# Patient Record
Sex: Male | Born: 1950 | Race: White | Hispanic: No | Marital: Married | Smoking: Current every day smoker
Health system: Southern US, Community
[De-identification: ages and names within clinical notes are randomized; demographics above are authoritative.]

## PROBLEM LIST (undated history)

## (undated) DIAGNOSIS — D8989 Other specified disorders involving the immune mechanism, not elsewhere classified: Secondary | ICD-10-CM

## (undated) DIAGNOSIS — I7782 Antineutrophilic cytoplasmic antibody (ANCA) vasculitis: Secondary | ICD-10-CM

## (undated) DIAGNOSIS — R569 Unspecified convulsions: Secondary | ICD-10-CM

## (undated) DIAGNOSIS — I776 Arteritis, unspecified: Secondary | ICD-10-CM

## (undated) HISTORY — PX: APPENDECTOMY: SHX54

---

## 2014-12-15 ENCOUNTER — Inpatient Hospital Stay (HOSPITAL_COMMUNITY)
Admission: EM | Admit: 2014-12-15 | Discharge: 2014-12-18 | DRG: 193 | Disposition: A | Discharge status: Home / Self Care | Payer: Medicare Other | Attending: Internal Medicine | Admitting: Internal Medicine

## 2014-12-15 ENCOUNTER — Encounter (HOSPITAL_COMMUNITY): Payer: Self-pay | Admitting: Emergency Medicine

## 2014-12-15 ENCOUNTER — Emergency Department (HOSPITAL_COMMUNITY): Payer: Medicare Other

## 2014-12-15 DIAGNOSIS — N183 Chronic kidney disease, stage 3 (moderate): Secondary | ICD-10-CM | POA: Diagnosis present

## 2014-12-15 DIAGNOSIS — D539 Nutritional anemia, unspecified: Secondary | ICD-10-CM | POA: Diagnosis present

## 2014-12-15 DIAGNOSIS — D72819 Decreased white blood cell count, unspecified: Secondary | ICD-10-CM | POA: Diagnosis present

## 2014-12-15 DIAGNOSIS — J9601 Acute respiratory failure with hypoxia: Secondary | ICD-10-CM

## 2014-12-15 DIAGNOSIS — Z9049 Acquired absence of other specified parts of digestive tract: Secondary | ICD-10-CM | POA: Diagnosis present

## 2014-12-15 DIAGNOSIS — D899 Disorder involving the immune mechanism, unspecified: Secondary | ICD-10-CM | POA: Diagnosis present

## 2014-12-15 DIAGNOSIS — G40909 Epilepsy, unspecified, not intractable, without status epilepticus: Secondary | ICD-10-CM | POA: Diagnosis present

## 2014-12-15 DIAGNOSIS — E876 Hypokalemia: Secondary | ICD-10-CM | POA: Diagnosis not present

## 2014-12-15 DIAGNOSIS — I776 Arteritis, unspecified: Secondary | ICD-10-CM

## 2014-12-15 DIAGNOSIS — M300 Polyarteritis nodosa: Secondary | ICD-10-CM | POA: Diagnosis present

## 2014-12-15 DIAGNOSIS — D649 Anemia, unspecified: Secondary | ICD-10-CM

## 2014-12-15 DIAGNOSIS — I129 Hypertensive chronic kidney disease with stage 1 through stage 4 chronic kidney disease, or unspecified chronic kidney disease: Secondary | ICD-10-CM | POA: Diagnosis present

## 2014-12-15 DIAGNOSIS — F17210 Nicotine dependence, cigarettes, uncomplicated: Secondary | ICD-10-CM | POA: Diagnosis present

## 2014-12-15 DIAGNOSIS — R509 Fever, unspecified: Secondary | ICD-10-CM | POA: Diagnosis not present

## 2014-12-15 DIAGNOSIS — R569 Unspecified convulsions: Secondary | ICD-10-CM

## 2014-12-15 DIAGNOSIS — J189 Pneumonia, unspecified organism: Secondary | ICD-10-CM | POA: Diagnosis not present

## 2014-12-15 DIAGNOSIS — I7782 Antineutrophilic cytoplasmic antibody (ANCA) vasculitis: Secondary | ICD-10-CM

## 2014-12-15 DIAGNOSIS — E785 Hyperlipidemia, unspecified: Secondary | ICD-10-CM | POA: Diagnosis present

## 2014-12-15 DIAGNOSIS — J9600 Acute respiratory failure, unspecified whether with hypoxia or hypercapnia: Secondary | ICD-10-CM

## 2014-12-15 HISTORY — DX: Antineutrophilic cytoplasmic antibody (ANCA) vasculitis: I77.82

## 2014-12-15 HISTORY — DX: Other specified disorders involving the immune mechanism, not elsewhere classified: D89.89

## 2014-12-15 HISTORY — DX: Unspecified convulsions: R56.9

## 2014-12-15 HISTORY — DX: Arteritis, unspecified: I77.6

## 2014-12-15 LAB — CBC WITH DIFFERENTIAL/PLATELET
Basophils Absolute: 0 10*3/uL (ref 0.0–0.1)
Basophils Relative: 0 % (ref 0–1)
EOS PCT: 0 % (ref 0–5)
Eosinophils Absolute: 0 10*3/uL (ref 0.0–0.7)
HCT: 23.9 % — ABNORMAL LOW (ref 39.0–52.0)
Hemoglobin: 8.3 g/dL — ABNORMAL LOW (ref 13.0–17.0)
LYMPHS ABS: 0.1 10*3/uL — AB (ref 0.7–4.0)
LYMPHS PCT: 3 % — AB (ref 12–46)
MCH: 38.2 pg — ABNORMAL HIGH (ref 26.0–34.0)
MCHC: 34.7 g/dL (ref 30.0–36.0)
MCV: 110.1 fL — AB (ref 78.0–100.0)
MONOS PCT: 9 % (ref 3–12)
Monocytes Absolute: 0.4 10*3/uL (ref 0.1–1.0)
Neutro Abs: 3.4 10*3/uL (ref 1.7–7.7)
Neutrophils Relative %: 88 % — ABNORMAL HIGH (ref 43–77)
PLATELETS: 188 10*3/uL (ref 150–400)
RBC: 2.17 MIL/uL — AB (ref 4.22–5.81)
RDW: 13.9 % (ref 11.5–15.5)
WBC: 3.9 10*3/uL — AB (ref 4.0–10.5)

## 2014-12-15 LAB — COMPREHENSIVE METABOLIC PANEL
ALBUMIN: 2.8 g/dL — AB (ref 3.5–5.2)
ALK PHOS: 73 U/L (ref 39–117)
ALT: 17 U/L (ref 0–53)
ANION GAP: 12 (ref 5–15)
AST: 15 U/L (ref 0–37)
BUN: 26 mg/dL — AB (ref 6–23)
CALCIUM: 8.9 mg/dL (ref 8.4–10.5)
CO2: 24 mmol/L (ref 19–32)
Chloride: 100 mmol/L (ref 96–112)
Creatinine, Ser: 1.71 mg/dL — ABNORMAL HIGH (ref 0.50–1.35)
GFR calc Af Amer: 47 mL/min — ABNORMAL LOW (ref >90)
GFR, EST NON AFRICAN AMERICAN: 41 mL/min — AB (ref >90)
GLUCOSE: 132 mg/dL — AB (ref 70–99)
Potassium: 3.6 mmol/L (ref 3.5–5.1)
Sodium: 136 mmol/L (ref 135–145)
TOTAL PROTEIN: 6 g/dL (ref 6.0–8.3)
Total Bilirubin: 0.7 mg/dL (ref 0.3–1.2)

## 2014-12-15 MED ORDER — LAMOTRIGINE 100 MG PO TABS
400.0000 mg | ORAL_TABLET | Freq: Two times a day (BID) | ORAL | Status: DC
  Administered 2014-12-15 – 2014-12-18 (×6): 400 mg via ORAL
  Filled 2014-12-15 (×8): qty 4

## 2014-12-15 MED ORDER — PROPRANOLOL HCL 80 MG PO TABS
80.0000 mg | ORAL_TABLET | Freq: Every day | ORAL | Status: DC
Start: 2014-12-16 — End: 2014-12-18
  Administered 2014-12-16 – 2014-12-18 (×3): 80 mg via ORAL
  Filled 2014-12-15 (×3): qty 1

## 2014-12-15 MED ORDER — ALBUTEROL SULFATE (2.5 MG/3ML) 0.083% IN NEBU
5.0000 mg | INHALATION_SOLUTION | RESPIRATORY_TRACT | Status: AC
  Administered 2014-12-15 (×3): 5 mg via RESPIRATORY_TRACT
  Filled 2014-12-15 (×3): qty 6

## 2014-12-15 MED ORDER — ENSURE COMPLETE PO LIQD: 237.0000 mL | Freq: Two times a day (BID) | ORAL | Status: DC

## 2014-12-15 MED ORDER — FUROSEMIDE 40 MG PO TABS
40.0000 mg | ORAL_TABLET | Freq: Every day | ORAL | Status: DC
  Administered 2014-12-16 – 2014-12-18 (×3): 40 mg via ORAL
  Filled 2014-12-15 (×3): qty 1

## 2014-12-15 MED ORDER — ACETAMINOPHEN 650 MG RE SUPP: 650.0000 mg | Freq: Four times a day (QID) | RECTAL | Status: DC | PRN

## 2014-12-15 MED ORDER — GUAIFENESIN-DM 100-10 MG/5ML PO SYRP: 10.0000 mL | ORAL_SOLUTION | Freq: Four times a day (QID) | ORAL | Status: DC | PRN

## 2014-12-15 MED ORDER — PANTOPRAZOLE SODIUM 40 MG PO TBEC
40.0000 mg | DELAYED_RELEASE_TABLET | Freq: Every day | ORAL | Status: DC
  Administered 2014-12-16 – 2014-12-18 (×3): 40 mg via ORAL
  Filled 2014-12-15 (×3): qty 1

## 2014-12-15 MED ORDER — TAMSULOSIN HCL 0.4 MG PO CAPS
0.4000 mg | ORAL_CAPSULE | Freq: Every day | ORAL | Status: DC
  Administered 2014-12-16 – 2014-12-18 (×3): 0.4 mg via ORAL
  Filled 2014-12-15 (×4): qty 1

## 2014-12-15 MED ORDER — DEXTROSE 5 % IV SOLN
1.0000 g | INTRAVENOUS | Status: DC
  Administered 2014-12-16 – 2014-12-17 (×2): 1 g via INTRAVENOUS
  Filled 2014-12-15 (×3): qty 1

## 2014-12-15 MED ORDER — SIMVASTATIN 40 MG PO TABS
40.0000 mg | ORAL_TABLET | Freq: Every day | ORAL | Status: DC
  Administered 2014-12-15 – 2014-12-17 (×3): 40 mg via ORAL
  Filled 2014-12-15 (×5): qty 1

## 2014-12-15 MED ORDER — SODIUM CHLORIDE 0.9 % IV SOLN: INTRAVENOUS | Status: DC

## 2014-12-15 MED ORDER — ACETAMINOPHEN 325 MG PO TABS
650.0000 mg | ORAL_TABLET | Freq: Four times a day (QID) | ORAL | Status: DC | PRN
  Administered 2014-12-17: 650 mg via ORAL
  Filled 2014-12-15: qty 2

## 2014-12-15 MED ORDER — OXYBUTYNIN CHLORIDE 5 MG PO TABS
5.0000 mg | ORAL_TABLET | Freq: Every day | ORAL | Status: DC
  Administered 2014-12-16 – 2014-12-18 (×3): 5 mg via ORAL
  Filled 2014-12-15 (×3): qty 1

## 2014-12-15 MED ORDER — VANCOMYCIN HCL IN DEXTROSE 1-5 GM/200ML-% IV SOLN
1000.0000 mg | INTRAVENOUS | Status: AC
  Administered 2014-12-15: 1000 mg via INTRAVENOUS
  Filled 2014-12-15: qty 200

## 2014-12-15 MED ORDER — OXYCODONE HCL 5 MG PO TABS: 5.0000 mg | ORAL_TABLET | ORAL | Status: DC | PRN

## 2014-12-15 MED ORDER — SERTRALINE HCL 50 MG PO TABS
50.0000 mg | ORAL_TABLET | Freq: Two times a day (BID) | ORAL | Status: DC
  Administered 2014-12-15 – 2014-12-18 (×6): 50 mg via ORAL
  Filled 2014-12-15 (×8): qty 1

## 2014-12-15 MED ORDER — LORAZEPAM 1 MG PO TABS
1.0000 mg | ORAL_TABLET | Freq: Two times a day (BID) | ORAL | Status: DC | PRN
  Administered 2014-12-16: 1 mg via ORAL
  Filled 2014-12-15: qty 1

## 2014-12-15 MED ORDER — SODIUM CHLORIDE 0.9 % IV SOLN
INTRAVENOUS | Status: DC
  Administered 2014-12-15 – 2014-12-17 (×5): via INTRAVENOUS

## 2014-12-15 MED ORDER — VANCOMYCIN HCL 500 MG IV SOLR
500.0000 mg | Freq: Two times a day (BID) | INTRAVENOUS | Status: DC
  Administered 2014-12-16 – 2014-12-18 (×5): 500 mg via INTRAVENOUS
  Filled 2014-12-15 (×6): qty 500

## 2014-12-15 MED ORDER — ATOVAQUONE 750 MG/5ML PO SUSP
1500.0000 mg | Freq: Every day | ORAL | Status: DC
Start: 2014-12-16 — End: 2014-12-18
  Administered 2014-12-16 – 2014-12-18 (×3): 1500 mg via ORAL
  Filled 2014-12-15 (×3): qty 10

## 2014-12-15 MED ORDER — ONDANSETRON HCL 4 MG PO TABS: 4.0000 mg | ORAL_TABLET | Freq: Four times a day (QID) | ORAL | Status: DC | PRN

## 2014-12-15 MED ORDER — HEPARIN SODIUM (PORCINE) 5000 UNIT/ML IJ SOLN
5000.0000 [IU] | Freq: Three times a day (TID) | INTRAMUSCULAR | Status: DC
  Administered 2014-12-15 – 2014-12-17 (×7): 5000 [IU] via SUBCUTANEOUS
  Filled 2014-12-15 (×11): qty 1

## 2014-12-15 MED ORDER — ALBUTEROL SULFATE (2.5 MG/3ML) 0.083% IN NEBU
2.5000 mg | INHALATION_SOLUTION | Freq: Four times a day (QID) | RESPIRATORY_TRACT | Status: DC | PRN
Start: 2014-12-15 — End: 2014-12-18
  Administered 2014-12-16: 2.5 mg via RESPIRATORY_TRACT
  Filled 2014-12-15: qty 3

## 2014-12-15 MED ORDER — DEXTROSE 5 % IV SOLN
1.0000 g | Freq: Once | INTRAVENOUS | Status: AC
  Administered 2014-12-15: 1 g via INTRAVENOUS
  Filled 2014-12-15: qty 1

## 2014-12-15 MED ORDER — ALUM & MAG HYDROXIDE-SIMETH 200-200-20 MG/5ML PO SUSP: 30.0000 mL | Freq: Four times a day (QID) | ORAL | Status: DC | PRN

## 2014-12-15 MED ORDER — CYCLOPHOSPHAMIDE 50 MG PO TABS: 150.0000 mg | ORAL_TABLET | Freq: Every day | ORAL | Status: DC

## 2014-12-15 MED ORDER — IPRATROPIUM BROMIDE 0.02 % IN SOLN
0.5000 mg | Freq: Once | RESPIRATORY_TRACT | Status: AC
  Administered 2014-12-15: 0.5 mg via RESPIRATORY_TRACT
  Filled 2014-12-15: qty 2.5

## 2014-12-15 MED ORDER — ONDANSETRON HCL 4 MG/2ML IJ SOLN: 4.0000 mg | Freq: Four times a day (QID) | INTRAMUSCULAR | Status: DC | PRN

## 2014-12-15 MED ORDER — ZOLPIDEM TARTRATE 10 MG PO TABS
10.0000 mg | ORAL_TABLET | Freq: Every evening | ORAL | Status: DC | PRN
  Administered 2014-12-17: 10 mg via ORAL
  Filled 2014-12-15: qty 1

## 2014-12-15 MED ORDER — HYDROMORPHONE HCL 1 MG/ML IJ SOLN: 0.5000 mg | INTRAMUSCULAR | Status: DC | PRN

## 2014-12-15 MED ORDER — LISINOPRIL 5 MG PO TABS
5.0000 mg | ORAL_TABLET | Freq: Every day | ORAL | Status: DC
Start: 2014-12-16 — End: 2014-12-18
  Administered 2014-12-16 – 2014-12-18 (×3): 5 mg via ORAL
  Filled 2014-12-15 (×3): qty 1

## 2014-12-15 MED ORDER — ADULT MULTIVITAMIN W/MINERALS CH
1.0000 | ORAL_TABLET | Freq: Every day | ORAL | Status: DC
  Administered 2014-12-16 – 2014-12-18 (×3): 1 via ORAL
  Filled 2014-12-15 (×3): qty 1

## 2014-12-15 NOTE — ED Notes (Signed)
Pt's wife states that pt has autoimmune disease, so they decided to take a trip to [place] bc not sure if they will have another.  First she had a virus then pt got the virus a week ago and been battling shob, cough, and fever.  [facility] and had EMS to come out earlier and assess him but wasn't transported.

## 2014-12-15 NOTE — Progress Notes (Signed)
Have received report from ED. Pt has not arrived to room. Called ED to ask about delay, was told that pt will be brought up at soon as a nurse or tech is available to bring the pt to the room. Eugene GarnetSarah M Supreme Rybarczyk RN, BSN

## 2014-12-15 NOTE — ED Notes (Signed)
Pt transported to XR.  

## 2014-12-15 NOTE — ED Provider Notes (Addendum)
CSN: [id number]     Arrival date & time 12/15/14  1529 History   First MD Initiated Contact with Patient 12/15/14 1611     Chief Complaint  Patient presents with  . Shortness of Breath  . coughing   . Fever    HPI   Pt is travelling from  [place] to [place].  In the last four  days he started having trouble with cough, fever, and congestion, green sputum.  Temp taken this am was 102. Some diarrhea.  No vomiting.    Family members had a recent similar illness.  They have recovered but he has not.  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  He is on multiple medications for that Past Medical History  Diagnosis Date  . Autoimmune disorder   . ANCA-associated vasculitis     with MPA  . Seizures    Past Surgical History  Procedure Laterality Date  . Appendectomy     No family history on file. History  Substance Use Topics  . Smoking status: Current Every Day Smoker    Types: Cigarettes  . Smokeless tobacco: Not on file  . Alcohol Use: Not on file    Review of Systems  Respiratory: Positive for cough.   Gastrointestinal:       Some difficulty swallowing   All other systems reviewed and are negative.     Allergies  Review of patient's allergies indicates no known allergies.  Home Medications   Prior to Admission medications   Medication Sig Start Date End Date Taking? Authorizing Provider  atovaquone (MEPRON) 750 MG/5ML suspension Take 1,500 mg by mouth daily.   Yes Historical Provider, MD  cyclophosphamide (CYTOXAN) 25 MG tablet Take 150 mg by mouth daily. Give on an empty stomach 1 hour before or 2 hours after meals.   Yes Historical Provider, MD  furosemide (LASIX) 40 MG tablet Take 40 mg by mouth daily.   Yes Historical Provider, MD  lamoTRIgine (LAMICTAL) 200 MG tablet Take 400 mg by mouth 2 (two) times daily.   Yes Historical Provider, MD  lisinopril (PRINIVIL,ZESTRIL) 5 MG tablet Take 5 mg by mouth daily.   Yes Historical Provider, MD  LORazepam  (ATIVAN) 1 MG tablet Take 1 mg by mouth 2 (two) times daily as needed for anxiety.   Yes Historical Provider, MD  Multiple Vitamins-Minerals (MULTIVITAMIN WITH MINERALS) tablet Take 1 tablet by mouth daily.   Yes Historical Provider, MD  omeprazole (PRILOSEC) 40 MG capsule Take 40 mg by mouth daily.   Yes Historical Provider, MD  oxybutynin (DITROPAN) 5 MG tablet Take 5 mg by mouth daily.   Yes Historical Provider, MD  propranolol (INDERAL) 80 MG tablet Take 80 mg by mouth daily.   Yes Historical Provider, MD  sertraline (ZOLOFT) 50 MG tablet Take 50 mg by mouth 2 (two) times daily.   Yes Historical Provider, MD  simvastatin (ZOCOR) 40 MG tablet Take 40 mg by mouth daily.   Yes Historical Provider, MD  tamsulosin (FLOMAX) 0.4 MG CAPS capsule Take 0.4 mg by mouth daily after breakfast.   Yes Historical Provider, MD  zolpidem (AMBIEN) 10 MG tablet Take 10 mg by mouth at bedtime as needed for sleep.   Yes Historical Provider, MD   BP 101/67 mmHg  Pulse 94  Temp(Src) 98.4 F (36.9 C) (Oral)  Resp 15  SpO2 95% Physical Exam  Constitutional: He has a sickly appearance. No distress.  frail  HENT:  Head: Normocephalic and atraumatic.  Right Ear: External ear normal.  Left Ear: External ear normal.  White exudate oral mucosa, dry mm  Eyes: Conjunctivae are normal. Right eye exhibits no discharge. Left eye exhibits no discharge. No scleral icterus.  Neck: Neck supple. No tracheal deviation present.  Cardiovascular: Normal rate, regular rhythm and intact distal pulses.   Pulmonary/Chest: Effort normal. No stridor. No respiratory distress. He has wheezes. He has rales.  Abdominal: Soft. Bowel sounds are normal. He exhibits no distension. There is no tenderness. There is no rebound and no guarding.  Musculoskeletal: He exhibits no edema or tenderness.  Neurological: He is alert. No cranial nerve deficit (no facial droop, extraocular movements intact, no slurred speech) or sensory deficit. He  exhibits normal muscle tone. He displays no seizure activity. Coordination normal.  General weakness  Skin: Skin is warm and dry. No rash noted.  Psychiatric: He has a normal mood and affect.  Nursing note and vitals reviewed.   ED Course  Procedures (including critical care time) Labs Review Labs Reviewed  CBC WITH DIFFERENTIAL/PLATELET - Abnormal; Notable for the following:    WBC 3.9 (*)    RBC 2.17 (*)    Hemoglobin 8.3 (*)    HCT 23.9 (*)    MCV 110.1 (*)    MCH 38.2 (*)    Neutrophils Relative % 88 (*)    Lymphocytes Relative 3 (*)    Lymphs Abs 0.1 (*)    All other components within normal limits  COMPREHENSIVE METABOLIC PANEL - Abnormal; Notable for the following:    Glucose, Bld 132 (*)    BUN 26 (*)    Creatinine, Ser 1.71 (*)    Albumin 2.8 (*)    GFR calc non Af Amer 41 (*)    GFR calc Af Amer 47 (*)    All other components within normal limits    Imaging Review Dg Chest 2 View  12/15/2014   CLINICAL DATA:  Shortness of breath cough and fever for 1 week  EXAM: CHEST  2 VIEW  COMPARISON:  None.  FINDINGS: Cardiac shadow is within normal limits. Increased density is noted in the right lung base projecting in the right lower lobe consistent with acute pneumonia. No bony abnormality is seen.  IMPRESSION: Changes in the right lower lobe consistent with pneumonia. Followup following appropriate therapy is recommended.   Electronically Signed   By: Alcide Clever M.D.   On: 12/15/2014 17:06   Medications  albuterol (PROVENTIL) (2.5 MG/3ML) 0.083% nebulizer solution 5 mg (5 mg Nebulization Given 12/15/14 1734)  ceFEPIme (MAXIPIME) 1 g in dextrose 5 % 50 mL IVPB (not administered)  ipratropium (ATROVENT) nebulizer solution 0.5 mg (0.5 mg Nebulization Given 12/15/14 1705)     MDM   Final diagnoses:  HCAP (healthcare-associated pneumonia)    Pt has pna on xray.  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  On chronic mepron to help prevent opportunistic infections.  Pt has been  receiving injections and exposed to HCAP pathogens.  Will start on hcap abx.  Admit considering his medical conditions, and hypoxia.  He is not on oxygen normally.  Family dose have prior labs with them.  His anemia, wbc count and renal function are unchanged from baseline  Pt and family agree    Linwood Dibbles, MD 12/15/14 [phone]

## 2014-12-15 NOTE — H&P (Signed)
[facility] Admission History and Physical       Ryan KindleWilliam Bonura UJW:[id number] DOB: Mar 07, 1951 DOA: 12/15/2014  Referring physician: EDP PCP: Pcp Not In System Dr Marianna FussLoren Hannah in [place] [facility]:   Chief Complaint: Fever Chills SOB Cough and Chest congestion  HPI: Ryan Gentry is a 64 y.o. male with a history of ANCA with Microscopic Polyangiitis ( dx 07/2014) who presents to the ED with complaints of worsening SOB, cough with Fevers and chills  X 4 days.   He reports having a temperature of 102 this AM.   He has been coughing up yellowish- green sputum.    He and his wife were traveling from [place] back home to [place].   He reports that he has nausea and vomiting due to his daily chemo Rx of cytoxan.  He has also had some diarrhea. In the ED, his o2 sats were down to 88% and he was placed on 2 liters NCO2 with improvement and a Chest X-Ray revealed a RLL Pneumonia and he was placed on IV Vancomycin and Cefepime and referred for admission.     Review of Systems:  Constitutional: No Weight Loss, No Weight Gain, Night Sweats,+Fevers, +Chills, Dizziness, Light Headedness, Fatigue, or +Generalized Weakness HEENT: No Headaches, Difficulty Swallowing,Tooth/Dental Problems,Sore Throat,  No Sneezing, Rhinitis, Ear Ache, Nasal Congestion, or Post Nasal Drip,  Cardio-vascular:  No Chest pain, Orthopnea, PND, Edema in Lower Extremities, Anasarca, Dizziness, Palpitations  Resp: +Dyspnea, No DOE, +Productive Cough, No Non-Productive Cough, No Hemoptysis, No Wheezing.    GI: No Heartburn, Indigestion, Abdominal Pain, Nausea, Vomiting, Diarrhea, Constipation, Hematemesis, Hematochezia, Melena, Change in Bowel Habits,  Loss of Appetite  GU: No Dysuria, No Change in Color of Urine, No Urgency or Urinary Frequency, No Flank pain.  Musculoskeletal: No Joint Pain or Swelling, No Decreased Range of Motion, No Back Pain.  Neurologic: No Syncope, No Seizures, Muscle Weakness, Paresthesia, Vision  Disturbance or Loss, No Diplopia, No Vertigo, No Difficulty Walking,  Skin: No Rash or Lesions. Psych: No Change in Mood or Affect, No Depression or Anxiety, No Memory loss, No Confusion, or Hallucinations   Past Medical History  Diagnosis Date  . Autoimmune disorder   . ANCA-associated vasculitis     with MPA  . Seizures      Past Surgical History  Procedure Laterality Date  . Appendectomy        Prior to Admission medications   Medication Sig Start Date End Date Taking? Authorizing Provider  atovaquone (MEPRON) 750 MG/5ML suspension Take 1,500 mg by mouth daily.   Yes Historical Provider, MD  cyclophosphamide (CYTOXAN) 25 MG tablet Take 150 mg by mouth daily. Give on an empty stomach 1 hour before or 2 hours after meals.   Yes Historical Provider, MD  furosemide (LASIX) 40 MG tablet Take 40 mg by mouth daily.   Yes Historical Provider, MD  lamoTRIgine (LAMICTAL) 200 MG tablet Take 400 mg by mouth 2 (two) times daily.   Yes Historical Provider, MD  lisinopril (PRINIVIL,ZESTRIL) 5 MG tablet Take 5 mg by mouth daily.   Yes Historical Provider, MD  LORazepam (ATIVAN) 1 MG tablet Take 1 mg by mouth 2 (two) times daily as needed for anxiety.   Yes Historical Provider, MD  Multiple Vitamins-Minerals (MULTIVITAMIN WITH MINERALS) tablet Take 1 tablet by mouth daily.   Yes Historical Provider, MD  omeprazole (PRILOSEC) 40 MG capsule Take 40 mg by mouth daily.   Yes Historical Provider, MD  oxybutynin (DITROPAN) 5 MG tablet Take 5  mg by mouth daily.   Yes Historical Provider, MD  propranolol (INDERAL) 80 MG tablet Take 80 mg by mouth daily.   Yes Historical Provider, MD  sertraline (ZOLOFT) 50 MG tablet Take 50 mg by mouth 2 (two) times daily.   Yes Historical Provider, MD  simvastatin (ZOCOR) 40 MG tablet Take 40 mg by mouth daily.   Yes Historical Provider, MD  tamsulosin (FLOMAX) 0.4 MG CAPS capsule Take 0.4 mg by mouth daily after breakfast.   Yes Historical Provider, MD  zolpidem  (AMBIEN) 10 MG tablet Take 10 mg by mouth at bedtime as needed for sleep.   Yes Historical Provider, MD     No Known Allergies     Social History:  reports that he has been smoking Cigarettes.  He has a 25 pack-year smoking history. He has never used smokeless tobacco. He reports that he does not drink alcohol or use illicit drugs.      History reviewed. No pertinent family history.     Physical Exam:  GEN:  Pleasant ill Appearing Well Nourished and Well Developed  64 y.o. Caucasian male examined and in no acute distress; cooperative with exam Filed Vitals:   12/15/14 1714 12/15/14 1715 12/15/14 1830 12/15/14 1846  BP:    115/66  Pulse:    86  Temp:      TempSrc:      Resp:    20  Height:    (1.778 m)   Weight:   69.854 kg (154 lb)   SpO2: 88% 95%  93%   Blood pressure 115/66, pulse 86, temperature 98.4 F (36.9 C), temperature source Oral, resp. rate 20, height  (1.778 m), weight 69.854 kg (154 lb), SpO2 93 %. PSYCH: He is alert and oriented x4; does not appear anxious does not appear depressed; affect is normal HEENT: Normocephalic and Atraumatic, Mucous membranes pink; PERRLA; EOM intact; Fundi:  Benign;  No scleral icterus, Nares: Patent, Oropharynx: Clear, Fair Dentition,    Neck:  FROM, No Cervical Lymphadenopathy nor Thyromegaly or Carotid Bruit; No JVD; Breasts:: Not examined CHEST WALL: No tenderness CHEST: Normal respiration, clear to auscultation bilaterally HEART: Regular rate and rhythm; no murmurs rubs or gallops BACK: No kyphosis or scoliosis; No CVA tenderness ABDOMEN: Positive Bowel Sounds, Soft Non-Tender, No Rebound or Guarding; No Masses, No Organomegaly. Rectal Exam: Not done EXTREMITIES: No Cyanosis, Clubbing, or Edema; No Ulcerations. Genitalia: not examined PULSES: 2+ and symmetric SKIN: Normal hydration no rash or ulceration CNS:  Alert and Oriented x 4, No Focal Deficits Vascular: pulses palpable throughout    Labs on Admission:    Basic Metabolic Panel:  Recent Labs Lab 12/15/14 1645  NA 136  K 3.6  CL 100  CO2 24  GLUCOSE 132*  BUN 26*  CREATININE 1.71*  CALCIUM 8.9   Liver Function Tests:  Recent Labs Lab 12/15/14 1645  AST 15  ALT 17  ALKPHOS 73  BILITOT 0.7  PROT 6.0  ALBUMIN 2.8*   No results for input(s): LIPASE, AMYLASE in the last 168 hours. No results for input(s): AMMONIA in the last 168 hours. CBC:  Recent Labs Lab 12/15/14 1645  WBC 3.9*  NEUTROABS 3.4  HGB 8.3*  HCT 23.9*  MCV 110.1*  PLT 188   Cardiac Enzymes: No results for input(s): CKTOTAL, CKMB, CKMBINDEX, TROPONINI in the last 168 hours.  BNP (last 3 results) No results for input(s): BNP in the last 8760 hours.  ProBNP (last 3 results) No results for  input(s): PROBNP in the last 8760 hours.  CBG: No results for input(s): GLUCAP in the last 168 hours.  Radiological Exams on Admission: Dg Chest 2 View  12/15/2014   CLINICAL DATA:  Shortness of breath cough and fever for 1 week  EXAM: CHEST  2 VIEW  COMPARISON:  None.  FINDINGS: Cardiac shadow is within normal limits. Increased density is noted in the right lung base projecting in the right lower lobe consistent with acute pneumonia. No bony abnormality is seen.  IMPRESSION: Changes in the right lower lobe consistent with pneumonia. Followup following appropriate therapy is recommended.   Electronically Signed   By: Alcide Clever M.D.   On: 12/15/2014 17:06       Assessment/Plan:   64 y.o. male with  Principal Problem:   1.     HCAP (healthcare-associated pneumonia)   IV Vancomycin and Cefepime   Albuterol Nebs   O2 PRN   Monitor O2 Sats `  Robitussin with Dextramethorphan q 6 hrs PRN   Active Problems:   2.    Acute respiratory failure   NCO2 PRN   Monitor O2 sats     3.    ANCA-positive vasculitis   On Cytoxan and Atovaquone Rx    Please Notify      4.    Seizures   Continue Lamictal Rx     5.    Leukopenia   Monitor Trend     6.     Anemia   Monitor Trend     7.    DVT Prophylaxis    SQ Heparin           Code Status:     FULL CODE        Family Communication:   Wife at Bedside       Disposition Plan:    Inpatient Status        Time spent:  21 Minutes      Ron Parker [facility] Pager [phone]   If 7AM -7PM Please Contact the Day Rounding Team MD for [facility]  If 7PM-7AM, Please Contact Night-Floor Coverage  [email or web address] Password [facility] 12/15/2014, 7:37 PM     ADDENDUM:   Patient was seen and examined on 12/15/2014

## 2014-12-15 NOTE — Progress Notes (Addendum)
ANTIBIOTIC CONSULT NOTE - INITIAL  Pharmacy Consult for Vancomycin & Cefepime Indication: pneumonia  No Known Allergies  Patient Measurements: Height: 5\' 10"  (177.8 cm) Weight: 154 lb (69.854 kg) IBW/kg (Calculated) : 73  Vital Signs: Temp: 98.4 F (36.9 C) (03/08 1602) Temp Source: Oral (03/08 1602) BP: 115/66 mmHg (03/08 1846) Pulse Rate: 86 (03/08 1846) Intake/Output from previous day:   Intake/Output from this shift:    Labs:  Recent Labs  12/15/14 1645  WBC 3.9*  HGB 8.3*  PLT 188  CREATININE 1.71*   Estimated Creatinine Clearance: 43.1 mL/min (by C-G formula based on Cr of 1.71). No results for input(s): VANCOTROUGH, VANCOPEAK, VANCORANDOM, GENTTROUGH, GENTPEAK, GENTRANDOM, TOBRATROUGH, TOBRAPEAK, TOBRARND, AMIKACINPEAK, AMIKACINTROU, AMIKACIN in the last 72 hours.   Microbiology: No results found for this or any previous visit (from the past 720 hour(s)).  Medical History: Past Medical History  Diagnosis Date  . Autoimmune disorder   . ANCA-associated vasculitis     with MPA  . Seizures     Medications:  Scheduled:   Infusions:  . [START ON 12/16/2014] vancomycin    . vancomycin 1,000 mg (12/15/14 1851)   Assessment:  [age] yr male with fever, productive cough and congestion. Autoimmune disorder vasculitis.  Recently exposed to sick family members.  Xray shows pneumonia  CrCl ~ 43 ml/min  Cefepime 1gm x 1 ordered by MD in ED  Pharmacy consulted to dose Vancomycin and Cefepime for pneumonia  Goal of Therapy:  Vancomycin trough level 15-20 mcg/ml  Plan:  Measure antibiotic drug levels at steady state Follow up culture results  Vancomycin 1gm IV x 1 then 500mg  IV q12h Cefepime 1gm IV q24h  Kimani Hovis, Joselyn GlassmanLeann Trefz, PharmD 12/15/2014,7:10 PM

## 2014-12-16 LAB — CBC
HCT: 21.2 % — ABNORMAL LOW (ref 39.0–52.0)
Hemoglobin: 7.2 g/dL — ABNORMAL LOW (ref 13.0–17.0)
MCH: 37.3 pg — ABNORMAL HIGH (ref 26.0–34.0)
MCHC: 34 g/dL (ref 30.0–36.0)
MCV: 109.8 fL — ABNORMAL HIGH (ref 78.0–100.0)
Platelets: 153 10*3/uL (ref 150–400)
RBC: 1.93 MIL/uL — AB (ref 4.22–5.81)
RDW: 14.2 % (ref 11.5–15.5)
WBC: 2.9 10*3/uL — ABNORMAL LOW (ref 4.0–10.5)

## 2014-12-16 LAB — VITAMIN B12: Vitamin B-12: 474 pg/mL (ref 211–911)

## 2014-12-16 LAB — BASIC METABOLIC PANEL
Anion gap: 7 (ref 5–15)
BUN: 23 mg/dL (ref 6–23)
CALCIUM: 8.1 mg/dL — AB (ref 8.4–10.5)
CHLORIDE: 105 mmol/L (ref 96–112)
CO2: 27 mmol/L (ref 19–32)
Creatinine, Ser: 1.62 mg/dL — ABNORMAL HIGH (ref 0.50–1.35)
GFR calc Af Amer: 50 mL/min — ABNORMAL LOW (ref >90)
GFR, EST NON AFRICAN AMERICAN: 43 mL/min — AB (ref >90)
Glucose, Bld: 90 mg/dL (ref 70–99)
POTASSIUM: 2.9 mmol/L — AB (ref 3.5–5.1)
Sodium: 139 mmol/L (ref 135–145)

## 2014-12-16 LAB — FERRITIN: Ferritin: 653 ng/mL — ABNORMAL HIGH (ref 22–322)

## 2014-12-16 LAB — PREPARE RBC (CROSSMATCH)

## 2014-12-16 LAB — ABO/RH: ABO/RH(D): B POS

## 2014-12-16 LAB — IRON AND TIBC
Iron: 41 ug/dL — ABNORMAL LOW (ref 42–165)
Saturation Ratios: 25 % (ref 20–55)
TIBC: 162 ug/dL — ABNORMAL LOW (ref 215–435)
UIBC: 121 ug/dL — ABNORMAL LOW (ref 125–400)

## 2014-12-16 LAB — FOLATE: Folate: >20 ng/mL

## 2014-12-16 LAB — RETICULOCYTES
RBC.: 1.97 MIL/uL — ABNORMAL LOW (ref 4.22–5.81)
Retic Count, Absolute: 23.6 K/uL (ref 19.0–186.0)
Retic Ct Pct: 1.2 % (ref 0.4–3.1)

## 2014-12-16 MED ORDER — POTASSIUM CHLORIDE CRYS ER 20 MEQ PO TBCR
40.0000 meq | EXTENDED_RELEASE_TABLET | Freq: Two times a day (BID) | ORAL | Status: AC
  Administered 2014-12-16 (×2): 40 meq via ORAL
  Filled 2014-12-16: qty 2

## 2014-12-16 MED ORDER — SODIUM CHLORIDE 0.9 % IV SOLN: Freq: Once | INTRAVENOUS | Status: DC

## 2014-12-16 MED ORDER — NEPRO/CARBSTEADY PO LIQD
237.0000 mL | Freq: Two times a day (BID) | ORAL | Status: DC
  Administered 2014-12-17: 237 mL via ORAL
  Filled 2014-12-16 (×5): qty 237

## 2014-12-16 MED ORDER — CYCLOPHOSPHAMIDE 50 MG PO TABS: 150.0000 mg | ORAL_TABLET | Freq: Every day | ORAL | Status: DC

## 2014-12-16 MED ORDER — DARBEPOETIN ALFA 40 MCG/0.4ML IJ SOSY
40.0000 ug | PREFILLED_SYRINGE | Freq: Once | INTRAMUSCULAR | Status: DC
  Filled 2014-12-16: qty 0.4

## 2014-12-16 NOTE — Progress Notes (Signed)
INITIAL NUTRITION ASSESSMENT  DOCUMENTATION CODES Per approved criteria  -Not Applicable   INTERVENTION: Consider switching diet order to renal diet as this is what the patient follows PTA Provide Nepro Shake po BID, each supplement provides 425 kcal and 19 grams protein Encourage PO intake RD to continue to monitor  NUTRITION DIAGNOSIS: Unintentional weight loss related to multiple co-morbidities as evidenced by 30 lb weight loss x 4.5 months.   Goal: Pt to meet >/= 90% of their estimated nutrition needs   Monitor:  PO and supplemental intake, weight, labs, I/O's  Reason for Assessment: Pt identified as at nutrition risk on the Malnutrition Screen Tool  Admitting Dx: HCAP (healthcare-associated pneumonia)  ASSESSMENT: 64 y.o. male with a history of ANCA with Microscopic Polyangiitis ( dx 07/2014) who presents to the ED with complaints of worsening SOB, cough with Fevers and chills X 4 days.  Pt reports following a renal diet PTA. Pt currently on regular diet and ordered Ensure supplements. 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 Pt with Hx of dialysis and plasmaphoresis which were stopped to start chemo.  Pt states he has had a good appetite. Pt states his dry weight is around 140-150 lb.   RD to order Nepro supplements for patient instead of Ensure d/t pt's weight loss of 30 lb over the last 4.5 months.   Nutrition focused physical exam shows no sign of depletion of muscle mass or body fat.  Labs reviewed: Low K Elevated Creatinine  Height: Ht Readings from Last 1 Encounters:  12/15/14 5\' 10"  (1.778 m)    Weight: Wt Readings from Last 1 Encounters:  12/15/14 151 lb 1.6 oz (68.539 kg)    Ideal Body Weight: 166 lb  % Ideal Body Weight: 91%  Wt Readings from Last 10 Encounters:  12/15/14 151 lb 1.6 oz (68.539 kg)    Usual Body Weight: 140-170 lb  % Usual Body Weight:  100%  BMI:  Body mass index is 21.68 kg/(m^2).  Estimated Nutritional Needs: Kcal: 1500-1700 Protein: 60-70g Fluid: 1.5L/day  Skin: intact  Diet Order: Diet regular  EDUCATION NEEDS: -No education needs identified at this time   Intake/Output Summary (Last 24 hours) at 12/16/14 1551 Last data filed at 12/16/14 0600  Gross per 24 hour  Intake    860 ml  Output    250 ml  Net    610 ml    Last BM: 3/8  Labs:   Recent Labs Lab 12/15/14 1645 12/16/14 0547  NA 136 139  K 3.6 2.9*  CL 100 105  CO2 24 27  BUN 26* 23  CREATININE 1.71* 1.62*  CALCIUM 8.9 8.1*  GLUCOSE 132* 90    CBG (last 3)  No results for input(s): GLUCAP in the last 72 hours.  Scheduled Meds: . sodium chloride   Intravenous Once  . atovaquone  1,500 mg Oral Daily  . ceFEPime (MAXIPIME) IV  1 g Intravenous Q24H  . darbepoetin (ARANESP) injection - NON-DIALYSIS  40 mcg Subcutaneous Once  . feeding supplement (NEPRO CARB STEADY)  237 mL Oral BID BM  . furosemide  40 mg Oral Daily  . heparin  5,000 Units Subcutaneous 3 times per day  . lamoTRIgine  400 mg Oral BID  . lisinopril  5 mg Oral Daily  . multivitamin with minerals  1 tablet Oral Daily  . oxybutynin  5 mg Oral Daily  . pantoprazole  40 mg  Oral Daily  . potassium chloride  40 mEq Oral BID  . propranolol  80 mg Oral Daily  . sertraline  50 mg Oral BID  . simvastatin  40 mg Oral q1800  . tamsulosin  0.4 mg Oral QPC breakfast  . vancomycin  500 mg Intravenous Q12H    Continuous Infusions: . sodium chloride 100 mL/hr at 12/16/14 0715    Past Medical History  Diagnosis Date  . Autoimmune disorder   . ANCA-associated vasculitis     with MPA  . Seizures     Past Surgical History  Procedure Laterality Date  . Appendectomy      Tilda Franco, MS, RD, LDN Pager: [phone] After Hours Pager: [phone]

## 2014-12-16 NOTE — Clinical Documentation Improvement (Addendum)
Query #1     (Please scroll down to view all queries) "Ryan Gentry is a 64 y.o. male with a history of ANCA with Microscopic Polyangiitis ( dx 07/2014)..." is documented in the H&P.  If possible, please provide a more specific diagnosis or condition related to the documentation in red italics above.  Possible conditions:  - Wegner's Granulomatosis  - Other ANCA condition  - The documented diagnosis, ANCA with Microscopic Polyangiitis, cannot be further specified  - Unable to clinically determine  Query #2 Patient currently being treated for HCAP with Maxipime and Vancomycin.  Patient reported nausea and vomiting prior to admission 2/2 to po chemo medications per H&P.  CXR on admission shows RLL changes - pneumonia.  Can the patient's HCAP be further clarified as:  - Probable Aspiration Pneumonia 2/2 to vomiting  - Probable, Likely or Suspected - organism specific Pneumonia based on antibiotic selection  - HCAP as documented and cannot be further specified  - Other Type of Pneumonia  - Unable to Clinically Determine  Thank You, Jerral Ralphathy R Flower Franko ,RN Clinical Documentation Specialist:  [phone] [facility] Health Information Management

## 2014-12-16 NOTE — Progress Notes (Signed)
[facility]                                                                              Patient Demographics  Ryan Gentry, is a 64 y.o. male, DOB - Mar 24, 1951, ZOX:[id number]  Admit date - 12/15/2014   Admitting Physician Ron Parker, MD  Outpatient Primary MD for the patient is Pcp Not In System  LOS - 1   Chief Complaint  Patient presents with  . Shortness of Breath  . coughing   . Fever      HPI on 12/15/2014 by Dr. Della Goo Ryan Gentry is a 64 y.o. male with a history of ANCA with Microscopic Polyangiitis ( dx 07/2014) who presents to the ED with complaints of worsening SOB, cough with Fevers and chills X 4 days. He reports having a temperature of 102 this AM. He has been coughing up yellowish- green sputum. He and his wife were traveling from [place] back home to [place]. He reports that he has nausea and vomiting due to his daily chemo Rx of cytoxan. He has also had some diarrhea. In the ED, his o2 sats were down to 88% and he was placed on 2 liters NCO2 with improvement and a Chest X-Ray revealed a RLL Pneumonia and he was placed on IV Vancomycin and Cefepime and referred for admission.   Assessment & Plan   Acute respiratory failure secondary to HCAP -CXR: RLL changes-pneumonia -Continue vancomycin and cefepime, nebs, oxygen to maintain sats >92%, antitussives  Microscopic Polyangitis (ANCA+) -Currently being treated by Dr. Thedore Mins in [facility] with her as well) -Continue Atovaquone -Cytoxan held due to leukopenia and infection (Spoke with Dr. Sung Amabile)  Seizure disorder -Continue lamictal  Leukopenia -Will continue to monitor, possibly secondary to cytoxan  Macrocytic Anemia -Hb 7.2, patient does not wish to have a blood transfusion -Will order aranesp once -Continue to monitor CBC  Hyperlipidemia -Continue statin  Hypokalemia  -Possibly secondary to diuretics -Wil replace and continue to monitor  BMP  Chronic kidney disease, stage III -At baseline, spoke with [place]  Hypertension -Stable, continue propranolol, Lasix, lisinopril  Code Status: Full  Family Communication: None at bedside, Wife via phone  Disposition Plan: Admitted. Continue current treatment for HCAP  Time Spent in minutes   30 minutes  Procedures  None   Consults   Nephrology, via phone  DVT Prophylaxis  Heparin  Lab Results  Component Value Date   PLT 153 12/16/2014    Medications  Scheduled Meds: . sodium chloride   Intravenous Once  . atovaquone  1,500 mg Oral Daily  . ceFEPime (MAXIPIME) IV  1 g Intravenous Q24H  . feeding supplement (NEPRO CARB STEADY)  237 mL Oral BID BM  . furosemide  40 mg Oral Daily  . heparin  5,000 Units Subcutaneous 3 times per day  . lamoTRIgine  400 mg Oral BID  . lisinopril  5 mg Oral Daily  . multivitamin with minerals  1 tablet Oral Daily  . oxybutynin  5 mg Oral Daily  . pantoprazole  40 mg Oral Daily  . potassium chloride  40 mEq Oral BID  . propranolol  80 mg Oral Daily  .  sertraline  50 mg Oral BID  . simvastatin  40 mg Oral q1800  . tamsulosin  0.4 mg Oral QPC breakfast  . vancomycin  500 mg Intravenous Q12H   Continuous Infusions: . sodium chloride 100 mL/hr at 12/16/14 0715   PRN Meds:.acetaminophen **OR** acetaminophen, albuterol, alum & mag hydroxide-simeth, guaiFENesin-dextromethorphan, HYDROmorphone (DILAUDID) injection, LORazepam, ondansetron **OR** ondansetron (ZOFRAN) IV, oxyCODONE, zolpidem  Antibiotics    Anti-infectives    Start     Dose/Rate Route Frequency Ordered Stop   12/16/14 1800  ceFEPIme (MAXIPIME) 1 g in dextrose 5 % 50 mL IVPB     1 g 100 mL/hr over 30 Minutes Intravenous Every 24 hours 12/15/14 1936     12/16/14 1000  atovaquone (MEPRON) 750 MG/5ML suspension 1,500 mg     1,500 mg Oral Daily 12/15/14 2029     12/16/14 0800  vancomycin (VANCOCIN) 500 mg in sodium chloride 0.9 % 100 mL IVPB     500 mg 100 mL/hr over 60  Minutes Intravenous Every 12 hours 12/15/14 1909     12/15/14 1845  vancomycin (VANCOCIN) IVPB 1000 mg/200 mL premix     1,000 mg 200 mL/hr over 60 Minutes Intravenous STAT 12/15/14 1830 12/15/14 1951   12/15/14 1830  ceFEPIme (MAXIPIME) 1 g in dextrose 5 % 50 mL IVPB     1 g 100 mL/hr over 30 Minutes Intravenous  Once 12/15/14 1809 12/15/14 1859        Subjective:   Ryan Gentry seen and examined today.  Patient feels his breathing is the same as when he came in, but feels his "cold is getting better."  He continues to have some cough.  He does not want to stay in the hospital.  He denies chest pain, dizziness, headache, abdominal pain.     Objective:   Filed Vitals:   12/15/14 2119 12/16/14 0347 12/16/14 0607 12/16/14 1420  BP: 113/65  152/83 128/68  Pulse: 90  107 87  Temp: 98.1 F (36.7 C)  98.5 F (36.9 C) 99.2 F (37.3 C)  TempSrc: Oral  Oral Oral  Resp: 20  20 20   Height: 5\' 10"  (1.778 m)     Weight: 68.539 kg (151 lb 1.6 oz)     SpO2: 92% 90% 91% 97%    Wt Readings from Last 3 Encounters:  12/15/14 68.539 kg (151 lb 1.6 oz)     Intake/Output Summary (Last 24 hours) at 12/16/14 1422 Last data filed at 12/16/14 0600  Gross per 24 hour  Intake    860 ml  Output    250 ml  Net    610 ml    Exam  General: Well developed, well nourished, NAD, appears stated age  HEENT: NCAT, mucous membranes moist.   Cardiovascular: S1 S2 auscultated, no murmurs appreciated, RRR  Respiratory: Diminished breath sounds, expiratory wheezing  Abdomen: Soft, nontender, nondistended, + bowel sounds  Extremities: warm dry without cyanosis clubbing or edema  Neuro: AAOx3, nonfocal  Psych: Appropriate mood and affect  Data Review   Micro Results No results found for this or any previous visit (from the past 240 hour(s)).  Radiology Reports Dg Chest 2 View  12/15/2014   CLINICAL DATA:  Shortness of breath cough and fever for 1 week  EXAM: CHEST  2 VIEW  COMPARISON:   None.  FINDINGS: Cardiac shadow is within normal limits. Increased density is noted in the right lung base projecting in the right lower lobe consistent with acute pneumonia. No bony abnormality  is seen.  IMPRESSION: Changes in the right lower lobe consistent with pneumonia. Followup following appropriate therapy is recommended.   Electronically Signed   By: Alcide Clever M.D.   On: 12/15/2014 17:06    CBC  Recent Labs Lab 12/15/14 1645 12/16/14 0547  WBC 3.9* 2.9*  HGB 8.3* 7.2*  HCT 23.9* 21.2*  PLT 188 153  MCV 110.1* 109.8*  MCH 38.2* 37.3*  MCHC 34.7 34.0  RDW 13.9 14.2  LYMPHSABS 0.1*  --   MONOABS 0.4  --   EOSABS 0.0  --   BASOSABS 0.0  --     Chemistries   Recent Labs Lab 12/15/14 1645 12/16/14 0547  NA 136 139  K 3.6 2.9*  CL 100 105  CO2 24 27  GLUCOSE 132* 90  BUN 26* 23  CREATININE 1.71* 1.62*  CALCIUM 8.9 8.1*  AST 15  --   ALT 17  --   ALKPHOS 73  --   BILITOT 0.7  --    ------------------------------------------------------------------------------------------------------------------ estimated creatinine clearance is 44.6 mL/min (by C-G formula based on Cr of 1.62). ------------------------------------------------------------------------------------------------------------------ No results for input(s): HGBA1C in the last 72 hours. ------------------------------------------------------------------------------------------------------------------ No results for input(s): CHOL, HDL, LDLCALC, TRIG, CHOLHDL, LDLDIRECT in the last 72 hours. ------------------------------------------------------------------------------------------------------------------ No results for input(s): TSH, T4TOTAL, T3FREE, THYROIDAB in the last 72 hours.  Invalid input(s): FREET3 ------------------------------------------------------------------------------------------------------------------  Recent Labs  12/16/14 0841  VITAMINB12 474  FOLATE >20.0  FERRITIN 653*   RETICCTPCT 1.2    Coagulation profile No results for input(s): INR, PROTIME in the last 168 hours.  No results for input(s): DDIMER in the last 72 hours.  Cardiac Enzymes No results for input(s): CKMB, TROPONINI, MYOGLOBIN in the last 168 hours.  Invalid input(s): CK ------------------------------------------------------------------------------------------------------------------ Invalid input(s): POCBNP    Ryan Gentry D.O. on 12/16/2014 at 2:22 PM  Between 7am to 7pm - Pager - [phone]  After 7pm go to [email or web address] - password [email or web address]  And look for the night coverage person covering for me after hours  [facility] Office  [phone]

## 2014-12-17 LAB — CBC
HCT: 22.1 % — ABNORMAL LOW (ref 39.0–52.0)
HEMOGLOBIN: 7.4 g/dL — AB (ref 13.0–17.0)
MCH: 37 pg — ABNORMAL HIGH (ref 26.0–34.0)
MCHC: 33.5 g/dL (ref 30.0–36.0)
MCV: 110.5 fL — ABNORMAL HIGH (ref 78.0–100.0)
PLATELETS: 145 10*3/uL — AB (ref 150–400)
RBC: 2 MIL/uL — ABNORMAL LOW (ref 4.22–5.81)
RDW: 14 % (ref 11.5–15.5)
WBC: 2.4 10*3/uL — ABNORMAL LOW (ref 4.0–10.5)

## 2014-12-17 LAB — TYPE AND SCREEN
ABO/RH(D): B POS
ANTIBODY SCREEN: NEGATIVE
Unit division: 0

## 2014-12-17 LAB — BASIC METABOLIC PANEL
ANION GAP: 8 (ref 5–15)
BUN: 21 mg/dL (ref 6–23)
CO2: 25 mmol/L (ref 19–32)
Calcium: 8.1 mg/dL — ABNORMAL LOW (ref 8.4–10.5)
Chloride: 106 mmol/L (ref 96–112)
Creatinine, Ser: 1.58 mg/dL — ABNORMAL HIGH (ref 0.50–1.35)
GFR calc Af Amer: 52 mL/min — ABNORMAL LOW (ref >90)
GFR, EST NON AFRICAN AMERICAN: 45 mL/min — AB (ref >90)
Glucose, Bld: 91 mg/dL (ref 70–99)
Potassium: 3.7 mmol/L (ref 3.5–5.1)
Sodium: 139 mmol/L (ref 135–145)

## 2014-12-17 MED ORDER — DARBEPOETIN ALFA 40 MCG/0.4ML IJ SOSY
40.0000 ug | PREFILLED_SYRINGE | Freq: Once | INTRAMUSCULAR | Status: AC
  Administered 2014-12-17: 40 ug via SUBCUTANEOUS
  Filled 2014-12-17: qty 0.4

## 2014-12-17 NOTE — Progress Notes (Signed)
[facility]                                                                              Patient Demographics  Ryan Gentry, is a 64 y.o. male, DOB - [birth year], ZOX:[id number]  Admit date - 12/15/2014   Admitting Physician Ron ParkerHarvette C Jenkins, MD  Outpatient Primary MD for the patient is Pcp Not In System  LOS - 2   Chief Complaint  Patient presents with  . Shortness of Breath  . coughing   . Fever      HPI on 12/15/2014 by Dr. Della GooHarvette Jenkins Ryan KindleWilliam Gentry is a 64 y.o. male with a history of ANCA with Microscopic Polyangiitis ( dx 07/2014) who presents to the ED with complaints of worsening SOB, cough with Fevers and chills X 4 days. He reports having a temperature of 102 this AM. He has been coughing up yellowish- green sputum. [place]. He reports that he has nausea and vomiting due to his daily chemo Rx of cytoxan. He has also had some diarrhea. In the ED, his o2 sats were down to 88% and he was placed on 2 liters NCO2 with improvement and a Chest X-Ray revealed a RLL Pneumonia and he was placed on IV Vancomycin and Cefepime and referred for admission.   Assessment & Plan   Acute respiratory failure secondary to HCAP -CXR: RLL changes-pneumonia -Breathing appears to have improved, patient is feeling better -Continue vancomycin and cefepime, nebs, oxygen to maintain sats >92%, antitussives  Microscopic Polyangitis (ANCA+) -[facility] (Spoke with her, and agreed with holding Cytoxan) -Continue Atovaquone -Cytoxan held due to leukopenia and infection ([place] Dr. Sung Am[place]-nephrology)  Seizure disorder -Continue lamictal  Leukopenia -Will continue to monitor, possibly secondary to cytoxan  Macrocytic Anemia -Hb 7.4, patient does not wish to have a blood transfusion -Aranesp 40mcg given once on 12/16/2014 -Continue to monitor CBC  Hyperlipidemia -Continue  statin  Hypokalemia  -Resolved, Possibly secondary to diuretics -Continue to monitor BMP  Chronic kidney disease, stage III -At baseline, [place], baseline 1.7 -Currently Cr 1.58  Hypertension -Stable, continue propranolol, Lasix, lisinopril (as Cr stable)  Code Status: Full  Family Communication: None at bedside, Wife via phone  Disposition Plan: Admitted. Continue current treatment for HCAP. Possible discharge 12/18/2014  Time Spent in minutes   30 minutes  Procedures  None   Consults   Nephrology, via phone  DVT Prophylaxis  Heparin  Lab Results  Component Value Date   PLT 145* 12/17/2014    Medications  Scheduled Meds: . sodium chloride   Intravenous Once  . atovaquone  1,500 mg Oral Daily  . ceFEPime (MAXIPIME) IV  1 g Intravenous Q24H  . darbepoetin (ARANESP) injection - NON-DIALYSIS  40 mcg Subcutaneous Once  . feeding supplement (NEPRO CARB STEADY)  237 mL Oral BID BM  . furosemide  40 mg Oral Daily  . heparin  5,000 Units Subcutaneous 3 times per day  . lamoTRIgine  400 mg Oral BID  . lisinopril  5 mg Oral Daily  . multivitamin with minerals  1 tablet Oral  Daily  . oxybutynin  5 mg Oral Daily  . pantoprazole  40 mg Oral Daily  . propranolol  80 mg Oral Daily  . sertraline  50 mg Oral BID  . simvastatin  40 mg Oral q1800  . tamsulosin  0.4 mg Oral QPC breakfast  . vancomycin  500 mg Intravenous Q12H   Continuous Infusions: . sodium chloride 100 mL/hr at 12/17/14 0526   PRN Meds:.acetaminophen **OR** acetaminophen, albuterol, alum & mag hydroxide-simeth, guaiFENesin-dextromethorphan, HYDROmorphone (DILAUDID) injection, LORazepam, ondansetron **OR** ondansetron (ZOFRAN) IV, oxyCODONE, zolpidem  Antibiotics    Anti-infectives    Start     Dose/Rate Route Frequency Ordered Stop   12/16/14 1800  ceFEPIme (MAXIPIME) 1 g in dextrose 5 % 50 mL IVPB     1 g 100 mL/hr over 30 Minutes Intravenous Every 24 hours 12/15/14 1936      12/16/14 1000  atovaquone (MEPRON) 750 MG/5ML suspension 1,500 mg     1,500 mg Oral Daily 12/15/14 2029     12/16/14 0800  vancomycin (VANCOCIN) 500 mg in sodium chloride 0.9 % 100 mL IVPB     500 mg 100 mL/hr over 60 Minutes Intravenous Every 12 hours 12/15/14 1909     12/15/14 1845  vancomycin (VANCOCIN) IVPB 1000 mg/200 mL premix     1,000 mg 200 mL/hr over 60 Minutes Intravenous STAT 12/15/14 1830 12/15/14 1951   12/15/14 1830  ceFEPIme (MAXIPIME) 1 g in dextrose 5 % 50 mL IVPB     1 g 100 mL/hr over 30 Minutes Intravenous  Once 12/15/14 1809 12/15/14 1859        Subjective:   Ryan Gentry seen and examined today.  Patient feels his breathing has improved. He continues to have some cough but states it is also better. Patient denies chest pain, dizziness, headache, abdominal pain, nausea or vomiting at this time.    Objective:   Filed Vitals:   12/16/14 0607 12/16/14 1420 12/16/14 2019 12/17/14 0531  BP: 152/83 128/68 117/62 131/78  Pulse: 107 87 84 80  Temp: 98.5 F (36.9 C) 99.2 F (37.3 C) 98.1 F (36.7 C) 98.1 F (36.7 C)  TempSrc: Oral Oral Oral Oral  Resp: Height:      Weight:      SpO2: 91% 97% 91% 96%    Wt Readings from Last 3 Encounters:  12/15/14 68.539 kg (151 lb 1.6 oz)     Intake/Output Summary (Last 24 hours) at 12/17/14 1009 Last data filed at 12/16/14 1658  Gross per 24 hour  Intake    240 ml  Output      0 ml  Net    240 ml    Exam  General: Well developed, well nourished, NAD  HEENT: NCAT, mucous membranes moist.   Cardiovascular: S1 S2 auscultated, RRR  Respiratory: Expiratory wheezing, improved. Good air movement.  Abdomen: Soft, nontender, nondistended, + bowel sounds  Extremities: warm dry without cyanosis clubbing or edema  Neuro: AAOx3, nonfocal  Psych: Pleasant, Appropriate mood and affect  Data Review   Micro Results No results found for this or any previous visit (from the past 240  hour(s)).  Radiology Reports Dg Chest 2 View  12/15/2014   CLINICAL DATA:  Shortness of breath cough and fever for 1 week  EXAM: CHEST  2 VIEW  COMPARISON:  None.  FINDINGS: Cardiac shadow is within normal limits. Increased density is noted in the right lung base projecting in the right lower lobe consistent  with acute pneumonia. No bony abnormality is seen.  IMPRESSION: Changes in the right lower lobe consistent with pneumonia. Followup following appropriate therapy is recommended.   Electronically Signed   By: Alcide Clever M.D.   On: 12/15/2014 17:06    CBC  Recent Labs Lab 12/15/14 1645 12/16/14 0547 12/17/14 0455  WBC 3.9* 2.9* 2.4*  HGB 8.3* 7.2* 7.4*  HCT 23.9* 21.2* 22.1*  PLT 188 153 145*  MCV 110.1* 109.8* 110.5*  MCH 38.2* 37.3* 37.0*  MCHC 34.7 34.0 33.5  RDW 13.9 14.2 14.0  LYMPHSABS 0.1*  --   --   MONOABS 0.4  --   --   EOSABS 0.0  --   --   BASOSABS 0.0  --   --     Chemistries   Recent Labs Lab 12/15/14 1645 12/16/14 0547 12/17/14 0455  NA 136 139 139  K 3.6 2.9* 3.7  CL 100 105 106  CO2 GLUCOSE 132* 90 91  BUN 26* 23 21  CREATININE 1.71* 1.62* 1.58*  CALCIUM 8.9 8.1* 8.1*  AST 15  --   --   ALT 17  --   --   ALKPHOS 73  --   --   BILITOT 0.7  --   --    ------------------------------------------------------------------------------------------------------------------ estimated creatinine clearance is 45.8 mL/min (by C-G formula based on Cr of 1.58). ------------------------------------------------------------------------------------------------------------------ No results for input(s): HGBA1C in the last 72 hours. ------------------------------------------------------------------------------------------------------------------ No results for input(s): CHOL, HDL, LDLCALC, TRIG, CHOLHDL, LDLDIRECT in the last 72 hours. ------------------------------------------------------------------------------------------------------------------ No  results for input(s): TSH, T4TOTAL, T3FREE, THYROIDAB in the last 72 hours.  Invalid input(s): FREET3 ------------------------------------------------------------------------------------------------------------------  Recent Labs  12/16/14 0841  VITAMINB12 474  FOLATE >20.0  FERRITIN 653*  TIBC 162*  IRON 41*  RETICCTPCT 1.2    Coagulation profile No results for input(s): INR, PROTIME in the last 168 hours.  No results for input(s): DDIMER in the last 72 hours.  Cardiac Enzymes No results for input(s): CKMB, TROPONINI, MYOGLOBIN in the last 168 hours.  Invalid input(s): CK ------------------------------------------------------------------------------------------------------------------ Invalid input(s): POCBNP    Adaisha Campise D.O. on 12/17/2014 at 10:09 AM  Between 7am to 7pm - Pager - [phone]  After 7pm go to [email or web address] - password [email or web address]  And look for the night coverage person covering for me after hours  [facility] Office  [phone]

## 2014-12-17 NOTE — Progress Notes (Signed)
Oral Chemotherapy Policy - Cytoxan  Per policy, order for Cytoxan has been placed on hold as it does not meet current criteria for continuing upon admission. Hold criteria are: Cyclophosphamide (Cytoxan) : Hgb < 8, ANC < 1000, Pltc < 100K, Active infection, Surgery planned this admission. Patient being treated for Pneumonia. Please reassess for appropriate restart once patient meets criteria  Hessie KnowsJustin M Tavonna Worthington, PharmD, BCPS Pager [phone] 12/17/2014 10:06 AM

## 2014-12-18 LAB — CBC
HCT: 20.7 % — ABNORMAL LOW (ref 39.0–52.0)
Hemoglobin: 7 g/dL — ABNORMAL LOW (ref 13.0–17.0)
MCH: 37 pg — AB (ref 26.0–34.0)
MCHC: 33.8 g/dL (ref 30.0–36.0)
MCV: 109.5 fL — ABNORMAL HIGH (ref 78.0–100.0)
Platelets: 145 10*3/uL — ABNORMAL LOW (ref 150–400)
RBC: 1.89 MIL/uL — ABNORMAL LOW (ref 4.22–5.81)
RDW: 13.9 % (ref 11.5–15.5)
WBC: 2.2 10*3/uL — ABNORMAL LOW (ref 4.0–10.5)

## 2014-12-18 MED ORDER — GUAIFENESIN-DM 100-10 MG/5ML PO SYRP: 10.0000 mL | ORAL_SOLUTION | Freq: Four times a day (QID) | ORAL | Status: DC | PRN

## 2014-12-18 MED ORDER — NEPRO/CARBSTEADY PO LIQD: 237.0000 mL | Freq: Two times a day (BID) | ORAL | Status: DC

## 2014-12-18 MED ORDER — LEVOFLOXACIN 750 MG PO TABS: 750.0000 mg | ORAL_TABLET | ORAL | Status: DC

## 2014-12-18 MED ORDER — DARBEPOETIN ALFA 25 MCG/0.42ML IJ SOSY
25.0000 ug | PREFILLED_SYRINGE | Freq: Once | INTRAMUSCULAR | Status: AC
  Administered 2014-12-18: 25 ug via SUBCUTANEOUS
  Filled 2014-12-18 (×2): qty 0.42

## 2014-12-18 MED ORDER — ALBUTEROL SULFATE HFA 108 (90 BASE) MCG/ACT IN AERS: 2.0000 | INHALATION_SPRAY | Freq: Four times a day (QID) | RESPIRATORY_TRACT | Status: AC | PRN

## 2014-12-18 MED ORDER — POLYETHYLENE GLYCOL 3350 17 G PO PACK: 17.0000 g | PACK | Freq: Every day | ORAL | Status: DC

## 2014-12-18 NOTE — Care Management Note (Signed)
Sandford Crazeora Kamrynn Melott RN,BSN,NCM [phone] IM letter given and explained to pt.

## 2014-12-18 NOTE — Discharge Summary (Signed)
Physician Discharge Summary  Ryan Gentry VOZ:[id number] DOB: [birth year] DOA: 12/15/2014  PCP: Pcp Not In System  Admit date: 12/15/2014 Discharge date: 12/18/2014  Time spent: 45 minutes  Recommendations for Outpatient Follow-up:  Patient will be discharged to home.  He will need to follow up with his primary care physician and nephrologist within one week of discharge.  Patient will need to continue his medications as prescribed.  He should continue a heart healthy diet and continue activity as tolerated.  Patient is driving to back to [place].  He was instructed to take frequent breaks.   Discharge Diagnoses:  Acute respiratory failure secondary to HCAP Microscopic polyangiitis Seizure disorder Leukopenia Macrocytic anemia Hyperlipidemia Chronic kidney disease, stage III Hypertension  Discharge Condition: Stable  Diet recommendation: Heart healthy  Filed Weights   12/15/14 1830 12/15/14 2119  Weight: 69.854 kg (154 lb) 68.539 kg (151 lb 1.6 oz)    History of present illness:  on 12/15/2014 by Dr. Della GooHarvette Jenkins Ryan KindleWilliam Wesche is a 64 y.o. male with a history of ANCA with Microscopic Polyangiitis ( dx 07/2014) who presents to the ED with complaints of worsening SOB, cough with Fevers and chills X 4 days. He reports having a temperature of 102 this AM. He has been coughing up yellowish- green sputum. He [place] his [place] were traveling from [place] back home to [place]. He reports that he has nausea and vomiting due to his daily chemo Rx of cytoxan. He has also had some diarrhea. In the ED, his o2 sats were down to 88% and he was placed on 2 liters NCO2 with improvement and a Chest X-Ray revealed a RLL Pneumonia and he was placed on IV Vancomycin and Cefepime and referred for admission.  Hospital Course:  Acute respiratory failure secondary to HCAP -CXR: RLL changes-pneumonia -Breathing appears to have improved, patient is feeling better -Initially placed vancomycin and  cefepime, nebs, oxygen to maintain sats >92%, antitussives -Patient was weaned off of nasal canula and has remained stable on room air -Will discharge patient with levaquin 750mg  q48hours  Microscopic Polyangitis (ANCA+) -[facility] (Spoke with her, and agreed with holding Cytoxan) -Continue Atovaquone -Cytoxan held due to leukopenia and infection (Spoke with Dr. Sung AmabileSchertz-nephrology)  Seizure disorder -Continue lamictal  Leukopenia -Will continue to monitor, possibly secondary to cytoxan  Macrocytic Anemia -Hb 7.0, patient does not wish to have a blood transfusion -Aranesp given -Patient will need to follow up with his nephrologist/PCP within 1 week of discharge  Hyperlipidemia -Continue statin  Hypokalemia  -Resolved, Possibly secondary to diuretics  Chronic kidney disease, stage III -At baseline, spoke with nephrologist in [place], baseline 1.7 -Currently Cr 1.58  Hypertension -Stable, continue propranolol, Lasix, lisinopril (as Cr stable)  Procedures  None   Consults  Nephrology, via phone  Discharge Exam: Filed Vitals:   12/18/14 0449  BP: 136/79  Pulse: 82  Temp: 97.7 F (36.5 C)  Resp: 18   Exam  General: Well developed, well nourished  HEENT: NCAT, mucous membranes moist.   Cardiovascular: S1 S2 auscultated, RRR  Respiratory: Clear to auscultation.   Abdomen: Soft, nontender, nondistended, + bowel sounds  Extremities: No clubbing, cyanosis, edema  Neuro: AAOx3, nonfocal  Psych: Pleasant, cooperative, Appropriate mood and affect  Discharge Instructions      Discharge Instructions    Discharge instructions    Complete by:  As directed   Patient will be discharged to home.  He will need to follow up with his primary  care physician and nephrologist within one week of discharge.  Patient will need to continue his medications as prescribed.  He should continue a heart healthy diet and continue activity as  tolerated.  Patient is driving to back to [place].  He was instructed to take frequent breaks.            Medication List    STOP taking these medications        cyclophosphamide 25 MG tablet  Commonly known as:  CYTOXAN      TAKE these medications        albuterol 108 (90 BASE) MCG/ACT inhaler  Commonly known as:  PROVENTIL HFA;VENTOLIN HFA  Inhale 2 puffs into the lungs every 6 (six) hours as needed for wheezing or shortness of breath.     atovaquone 750 MG/5ML suspension  Commonly known as:  MEPRON  Take 1,500 mg by mouth daily.     feeding supplement (NEPRO CARB STEADY) Liqd  Take 237 mLs by mouth 2 (two) times daily between meals.     furosemide 40 MG tablet  Commonly known as:  LASIX  Take 40 mg by mouth daily.     guaiFENesin-dextromethorphan 100-10 MG/5ML syrup  Commonly known as:  ROBITUSSIN DM  Take 10 mLs by mouth every 6 (six) hours as needed for cough.     lamoTRIgine 200 MG tablet  Commonly known as:  LAMICTAL  Take 400 mg by mouth 2 (two) times daily.     levofloxacin 750 MG tablet  Commonly known as:  LEVAQUIN  Take 1 tablet (750 mg total) by mouth every other day.     lisinopril 5 MG tablet  Commonly known as:  PRINIVIL,ZESTRIL  Take 5 mg by mouth daily.     LORazepam 1 MG tablet  Commonly known as:  ATIVAN  Take 1 mg by mouth 2 (two) times daily as needed for anxiety.     multivitamin with minerals tablet  Take 1 tablet by mouth daily.     omeprazole 40 MG capsule  Commonly known as:  PRILOSEC  Take 40 mg by mouth daily.     oxybutynin 5 MG tablet  Commonly known as:  DITROPAN  Take 5 mg by mouth daily.     polyethylene glycol packet  Commonly known as:  MIRALAX / GLYCOLAX  Take 17 g by mouth daily.     propranolol 80 MG tablet  Commonly known as:  INDERAL  Take 80 mg by mouth daily.     sertraline 50 MG tablet  Commonly known as:  ZOLOFT  Take 50 mg by mouth 2 (two) times daily.     simvastatin 40 MG tablet  Commonly known as:   ZOCOR  Take 40 mg by mouth daily.     tamsulosin 0.4 MG Caps capsule  Commonly known as:  FLOMAX  Take 0.4 mg by mouth daily after breakfast.     zolpidem 10 MG tablet  Commonly known as:  AMBIEN  Take 10 mg by mouth at bedtime as needed for sleep.       No Known Allergies Follow-up Information    Follow up with Nephrologist, Dr. Thedore Mins.       The results of significant diagnostics from this hospitalization (including imaging, microbiology, ancillary and laboratory) are listed below for reference.    Significant Diagnostic Studies: Dg Chest 2 View  12/15/2014   CLINICAL DATA:  Shortness of breath cough and fever for 1 week  EXAM: CHEST  2 VIEW  COMPARISON:  None.  FINDINGS: Cardiac shadow is within normal limits. Increased density is noted in the right lung base projecting in the right lower lobe consistent with acute pneumonia. No bony abnormality is seen.  IMPRESSION: Changes in the right lower lobe consistent with pneumonia. Followup following appropriate therapy is recommended.   Electronically Signed   By: Alcide Clever M.D.   On: 12/15/2014 17:06    Microbiology: No results found for this or any previous visit (from the past 240 hour(s)).   Labs: Basic Metabolic Panel:  Recent Labs Lab 12/15/14 1645 12/16/14 0547 12/17/14 0455  NA 136 139 139  K 3.6 2.9* 3.7  CL 100 105 106  CO2 GLUCOSE 132* 90 91  BUN 26* 23 21  CREATININE 1.71* 1.62* 1.58*  CALCIUM 8.9 8.1* 8.1*   Liver Function Tests:  Recent Labs Lab 12/15/14 1645  AST 15  ALT 17  ALKPHOS 73  BILITOT 0.7  PROT 6.0  ALBUMIN 2.8*   No results for input(s): LIPASE, AMYLASE in the last 168 hours. No results for input(s): AMMONIA in the last 168 hours. CBC:  Recent Labs Lab 12/15/14 1645 12/16/14 0547 12/17/14 0455 12/18/14 0445  WBC 3.9* 2.9* 2.4* 2.2*  NEUTROABS 3.4  --   --   --   HGB 8.3* 7.2* 7.4* 7.0*  HCT 23.9* 21.2* 22.1* 20.7*  MCV 110.1* 109.8* 110.5* 109.5*  PLT 188 153  145* 145*   Cardiac Enzymes: No results for input(s): CKTOTAL, CKMB, CKMBINDEX, TROPONINI in the last 168 hours. BNP: BNP (last 3 results) No results for input(s): BNP in the last 8760 hours.  ProBNP (last 3 results) No results for input(s): PROBNP in the last 8760 hours.  CBG: No results for input(s): GLUCAP in the last 168 hours.     SignedEdsel Petrin  [facility] 12/18/2014, 9:34 AM

## 2014-12-18 NOTE — Discharge Instructions (Signed)

## 2021-02-07 ENCOUNTER — Encounter (HOSPITAL_COMMUNITY): Payer: Self-pay | Admitting: Emergency Medicine

## 2021-02-07 ENCOUNTER — Emergency Department (HOSPITAL_COMMUNITY): Payer: Medicare Other

## 2021-02-07 ENCOUNTER — Inpatient Hospital Stay (HOSPITAL_COMMUNITY)
Admission: EM | Admit: 2021-02-07 | Discharge: 2021-02-15 | DRG: 871 | Disposition: A | Discharge status: Home Health | Payer: Medicare Other | Attending: Internal Medicine | Admitting: Internal Medicine

## 2021-02-07 ENCOUNTER — Other Ambulatory Visit: Payer: Self-pay

## 2021-02-07 DIAGNOSIS — J159 Unspecified bacterial pneumonia: Secondary | ICD-10-CM | POA: Diagnosis present

## 2021-02-07 DIAGNOSIS — J189 Pneumonia, unspecified organism: Secondary | ICD-10-CM | POA: Diagnosis present

## 2021-02-07 DIAGNOSIS — J440 Chronic obstructive pulmonary disease with acute lower respiratory infection: Secondary | ICD-10-CM | POA: Diagnosis present

## 2021-02-07 DIAGNOSIS — N400 Enlarged prostate without lower urinary tract symptoms: Secondary | ICD-10-CM | POA: Diagnosis present

## 2021-02-07 DIAGNOSIS — R6520 Severe sepsis without septic shock: Secondary | ICD-10-CM | POA: Diagnosis present

## 2021-02-07 DIAGNOSIS — I4510 Unspecified right bundle-branch block: Secondary | ICD-10-CM | POA: Diagnosis present

## 2021-02-07 DIAGNOSIS — D84821 Immunodeficiency due to drugs: Secondary | ICD-10-CM | POA: Diagnosis present

## 2021-02-07 DIAGNOSIS — F17210 Nicotine dependence, cigarettes, uncomplicated: Secondary | ICD-10-CM | POA: Diagnosis present

## 2021-02-07 DIAGNOSIS — E871 Hypo-osmolality and hyponatremia: Secondary | ICD-10-CM | POA: Diagnosis present

## 2021-02-07 DIAGNOSIS — J9601 Acute respiratory failure with hypoxia: Secondary | ICD-10-CM | POA: Diagnosis present

## 2021-02-07 DIAGNOSIS — A419 Sepsis, unspecified organism: Secondary | ICD-10-CM | POA: Diagnosis present

## 2021-02-07 DIAGNOSIS — E861 Hypovolemia: Secondary | ICD-10-CM | POA: Diagnosis present

## 2021-02-07 DIAGNOSIS — Z79899 Other long term (current) drug therapy: Secondary | ICD-10-CM | POA: Diagnosis not present

## 2021-02-07 DIAGNOSIS — G40909 Epilepsy, unspecified, not intractable, without status epilepticus: Secondary | ICD-10-CM | POA: Diagnosis present

## 2021-02-07 DIAGNOSIS — E860 Dehydration: Secondary | ICD-10-CM | POA: Diagnosis present

## 2021-02-07 DIAGNOSIS — Z720 Tobacco use: Secondary | ICD-10-CM | POA: Diagnosis not present

## 2021-02-07 DIAGNOSIS — F411 Generalized anxiety disorder: Secondary | ICD-10-CM | POA: Diagnosis present

## 2021-02-07 DIAGNOSIS — R778 Other specified abnormalities of plasma proteins: Secondary | ICD-10-CM | POA: Diagnosis not present

## 2021-02-07 DIAGNOSIS — R5381 Other malaise: Secondary | ICD-10-CM | POA: Diagnosis present

## 2021-02-07 DIAGNOSIS — R569 Unspecified convulsions: Secondary | ICD-10-CM

## 2021-02-07 DIAGNOSIS — N401 Enlarged prostate with lower urinary tract symptoms: Secondary | ICD-10-CM | POA: Diagnosis present

## 2021-02-07 DIAGNOSIS — E785 Hyperlipidemia, unspecified: Secondary | ICD-10-CM | POA: Diagnosis present

## 2021-02-07 DIAGNOSIS — K219 Gastro-esophageal reflux disease without esophagitis: Secondary | ICD-10-CM | POA: Diagnosis present

## 2021-02-07 DIAGNOSIS — I10 Essential (primary) hypertension: Secondary | ICD-10-CM | POA: Diagnosis present

## 2021-02-07 DIAGNOSIS — R32 Unspecified urinary incontinence: Secondary | ICD-10-CM | POA: Diagnosis present

## 2021-02-07 DIAGNOSIS — I7789 Other specified disorders of arteries and arterioles: Secondary | ICD-10-CM | POA: Diagnosis present

## 2021-02-07 DIAGNOSIS — R112 Nausea with vomiting, unspecified: Secondary | ICD-10-CM | POA: Diagnosis present

## 2021-02-07 DIAGNOSIS — Z20822 Contact with and (suspected) exposure to covid-19: Secondary | ICD-10-CM | POA: Diagnosis present

## 2021-02-07 DIAGNOSIS — T361X5A Adverse effect of cephalosporins and other beta-lactam antibiotics, initial encounter: Secondary | ICD-10-CM | POA: Diagnosis present

## 2021-02-07 DIAGNOSIS — R0600 Dyspnea, unspecified: Secondary | ICD-10-CM

## 2021-02-07 DIAGNOSIS — E876 Hypokalemia: Secondary | ICD-10-CM | POA: Diagnosis present

## 2021-02-07 LAB — COMPREHENSIVE METABOLIC PANEL
ALT: 14 U/L (ref 0–44)
AST: 17 U/L (ref 15–41)
Albumin: 3 g/dL — ABNORMAL LOW (ref 3.5–5.0)
Alkaline Phosphatase: 90 U/L (ref 38–126)
Anion gap: 9 (ref 5–15)
BUN: 23 mg/dL (ref 8–23)
CO2: 22 mmol/L (ref 22–32)
Calcium: 8.6 mg/dL — ABNORMAL LOW (ref 8.9–10.3)
Chloride: 102 mmol/L (ref 98–111)
Creatinine, Ser: 1.81 mg/dL — ABNORMAL HIGH (ref 0.61–1.24)
GFR, Estimated: 40 mL/min — ABNORMAL LOW (ref >60)
Glucose, Bld: 162 mg/dL — ABNORMAL HIGH (ref 70–99)
Potassium: 3.6 mmol/L (ref 3.5–5.1)
Sodium: 133 mmol/L — ABNORMAL LOW (ref 135–145)
Total Bilirubin: 0.7 mg/dL (ref 0.3–1.2)
Total Protein: 5.5 g/dL — ABNORMAL LOW (ref 6.5–8.1)

## 2021-02-07 LAB — CBC WITH DIFFERENTIAL/PLATELET
Abs Immature Granulocytes: 0.11 10*3/uL — ABNORMAL HIGH (ref 0.00–0.07)
Basophils Absolute: 0 10*3/uL (ref 0.0–0.1)
Basophils Relative: 0 %
Eosinophils Absolute: 0 10*3/uL (ref 0.0–0.5)
Eosinophils Relative: 0 %
HCT: 34.8 % — ABNORMAL LOW (ref 39.0–52.0)
Hemoglobin: 11.2 g/dL — ABNORMAL LOW (ref 13.0–17.0)
Immature Granulocytes: 1 %
Lymphocytes Relative: 3 %
Lymphs Abs: 0.4 10*3/uL — ABNORMAL LOW (ref 0.7–4.0)
MCH: 30.6 pg (ref 26.0–34.0)
MCHC: 32.2 g/dL (ref 30.0–36.0)
MCV: 95.1 fL (ref 80.0–100.0)
Monocytes Absolute: 0.4 10*3/uL (ref 0.1–1.0)
Monocytes Relative: 3 %
Neutro Abs: 12.5 10*3/uL — ABNORMAL HIGH (ref 1.7–7.7)
Neutrophils Relative %: 93 %
Platelets: 158 10*3/uL (ref 150–400)
RBC: 3.66 MIL/uL — ABNORMAL LOW (ref 4.22–5.81)
RDW: 12.6 % (ref 11.5–15.5)
WBC: 13.5 10*3/uL — ABNORMAL HIGH (ref 4.0–10.5)
nRBC: 0 % (ref 0.0–0.2)

## 2021-02-07 LAB — TROPONIN I (HIGH SENSITIVITY)
Troponin I (High Sensitivity): 38 ng/L — ABNORMAL HIGH (ref <18)
Troponin I (High Sensitivity): 35 ng/L — ABNORMAL HIGH (ref <18)

## 2021-02-07 LAB — APTT: aPTT: 31 seconds (ref 24–36)

## 2021-02-07 LAB — URINALYSIS, ROUTINE W REFLEX MICROSCOPIC
Bilirubin Urine: NEGATIVE
Glucose, UA: NEGATIVE mg/dL
Hgb urine dipstick: NEGATIVE
Ketones, ur: NEGATIVE mg/dL
Leukocytes,Ua: NEGATIVE
Nitrite: NEGATIVE
Protein, ur: NEGATIVE mg/dL
Specific Gravity, Urine: 1.012 (ref 1.005–1.030)
pH: 5 (ref 5.0–8.0)

## 2021-02-07 LAB — RESP PANEL BY RT-PCR (FLU A&B, COVID) ARPGX2
Influenza A by PCR: NEGATIVE
Influenza B by PCR: NEGATIVE
SARS Coronavirus 2 by RT PCR: NEGATIVE

## 2021-02-07 LAB — AMMONIA: Ammonia: 24 umol/L (ref 9–35)

## 2021-02-07 LAB — PROTIME-INR
INR: 1.3 — ABNORMAL HIGH (ref 0.8–1.2)
Prothrombin Time: 15.9 seconds — ABNORMAL HIGH (ref 11.4–15.2)

## 2021-02-07 LAB — LACTIC ACID, PLASMA
Lactic Acid, Venous: 1.4 mmol/L (ref 0.5–1.9)
Lactic Acid, Venous: 1.2 mmol/L (ref 0.5–1.9)

## 2021-02-07 LAB — LIPASE, BLOOD: Lipase: 31 U/L (ref 11–51)

## 2021-02-07 MED ORDER — MYCOPHENOLATE MOFETIL 250 MG PO CAPS
500.0000 mg | ORAL_CAPSULE | Freq: Two times a day (BID) | ORAL | Status: DC
  Administered 2021-02-07 – 2021-02-15 (×16): 500 mg via ORAL
  Filled 2021-02-07 (×18): qty 2

## 2021-02-07 MED ORDER — FINASTERIDE 5 MG PO TABS
5.0000 mg | ORAL_TABLET | Freq: Every day | ORAL | Status: DC
  Administered 2021-02-08 – 2021-02-15 (×8): 5 mg via ORAL
  Filled 2021-02-07 (×8): qty 1

## 2021-02-07 MED ORDER — ACETAMINOPHEN 325 MG PO TABS: 650.0000 mg | ORAL_TABLET | Freq: Four times a day (QID) | ORAL | Status: DC | PRN

## 2021-02-07 MED ORDER — VANCOMYCIN HCL 1500 MG/300ML IV SOLN
1500.0000 mg | Freq: Once | INTRAVENOUS | Status: AC
  Administered 2021-02-07: 1500 mg via INTRAVENOUS
  Filled 2021-02-07: qty 300

## 2021-02-07 MED ORDER — SIMVASTATIN 20 MG PO TABS
40.0000 mg | ORAL_TABLET | Freq: Every day | ORAL | Status: DC
  Administered 2021-02-08 – 2021-02-15 (×8): 40 mg via ORAL
  Filled 2021-02-07 (×8): qty 2

## 2021-02-07 MED ORDER — ALBUTEROL SULFATE (2.5 MG/3ML) 0.083% IN NEBU: 2.5000 mg | INHALATION_SOLUTION | RESPIRATORY_TRACT | Status: DC | PRN

## 2021-02-07 MED ORDER — METRONIDAZOLE 500 MG/100ML IV SOLN
500.0000 mg | Freq: Once | INTRAVENOUS | Status: AC
  Administered 2021-02-07: 500 mg via INTRAVENOUS
  Filled 2021-02-07: qty 100

## 2021-02-07 MED ORDER — VANCOMYCIN HCL 1000 MG/200ML IV SOLN: 1000.0000 mg | Freq: Once | INTRAVENOUS | Status: DC

## 2021-02-07 MED ORDER — SODIUM CHLORIDE 0.9 % IV BOLUS (SEPSIS)
250.0000 mL | Freq: Once | INTRAVENOUS | Status: AC
  Administered 2021-02-07: 250 mL via INTRAVENOUS

## 2021-02-07 MED ORDER — SODIUM CHLORIDE 0.9 % IV SOLN
500.0000 mg | INTRAVENOUS | Status: AC
  Administered 2021-02-07 – 2021-02-11 (×5): 500 mg via INTRAVENOUS
  Filled 2021-02-07 (×6): qty 500

## 2021-02-07 MED ORDER — LORAZEPAM 2 MG/ML IJ SOLN
0.5000 mg | INTRAMUSCULAR | Status: DC | PRN
  Administered 2021-02-08: 0.5 mg via INTRAVENOUS
  Filled 2021-02-07: qty 1

## 2021-02-07 MED ORDER — LACTATED RINGERS IV SOLN: INTRAVENOUS | Status: AC

## 2021-02-07 MED ORDER — SODIUM CHLORIDE 0.9 % IV SOLN
2.0000 g | Freq: Once | INTRAVENOUS | Status: AC
  Administered 2021-02-07: 2 g via INTRAVENOUS
  Filled 2021-02-07: qty 2

## 2021-02-07 MED ORDER — METOPROLOL TARTRATE 25 MG PO TABS
37.5000 mg | ORAL_TABLET | Freq: Two times a day (BID) | ORAL | Status: DC
  Administered 2021-02-08 – 2021-02-15 (×15): 37.5 mg via ORAL
  Filled 2021-02-07 (×15): qty 1

## 2021-02-07 MED ORDER — SODIUM CHLORIDE 0.9 % IV BOLUS (SEPSIS)
1000.0000 mL | Freq: Once | INTRAVENOUS | Status: AC
  Administered 2021-02-07: 1000 mL via INTRAVENOUS

## 2021-02-07 MED ORDER — LISINOPRIL 5 MG PO TABS
2.5000 mg | ORAL_TABLET | Freq: Every day | ORAL | Status: DC
  Administered 2021-02-08 – 2021-02-15 (×8): 2.5 mg via ORAL
  Filled 2021-02-07 (×8): qty 1

## 2021-02-07 MED ORDER — SODIUM CHLORIDE 0.9 % IV SOLN
2.0000 g | Freq: Two times a day (BID) | INTRAVENOUS | Status: AC
  Administered 2021-02-07 – 2021-02-13 (×13): 2 g via INTRAVENOUS
  Filled 2021-02-07 (×13): qty 2

## 2021-02-07 MED ORDER — ACETAMINOPHEN 650 MG RE SUPP
650.0000 mg | Freq: Four times a day (QID) | RECTAL | Status: DC | PRN
  Administered 2021-02-09: 650 mg via RECTAL
  Filled 2021-02-07: qty 1

## 2021-02-07 MED ORDER — LAMOTRIGINE 100 MG PO TABS
400.0000 mg | ORAL_TABLET | Freq: Two times a day (BID) | ORAL | Status: DC
  Administered 2021-02-07 – 2021-02-15 (×16): 400 mg via ORAL
  Filled 2021-02-07 (×11): qty 4
  Filled 2021-02-07: qty 1
  Filled 2021-02-07 (×4): qty 4

## 2021-02-07 MED ORDER — VANCOMYCIN HCL 750 MG/150ML IV SOLN
750.0000 mg | INTRAVENOUS | Status: DC
  Filled 2021-02-07: qty 150

## 2021-02-07 NOTE — Progress Notes (Signed)
Checking with bedside RN about results of first lactate

## 2021-02-07 NOTE — ED Provider Notes (Signed)
[facility] EMERGENCY [facility] Provider Note   CSN: [id number] Arrival date & time: 02/07/21  1439     History Chief Complaint  Patient presents with  . Cough  . Fever  . Nausea    [name] is a 70 y.o. male.  The history is provided by the patient and a significant other.  Fever Temp source:  Oral Severity:  Moderate Onset quality:  Gradual Duration:  3 days Timing:  Constant Progression:  Waxing and waning Chronicity:  New Relieved by:  Nothing Worsened by:  Nothing Ineffective treatments:  Acetaminophen Associated symptoms: chest pain, chills, congestion, cough, diarrhea, dysuria, nausea and vomiting   Associated symptoms: no confusion, no headaches, no myalgias and no rash   Risk factors: immunosuppression        Past Medical History:  Diagnosis Date  . ANCA-associated vasculitis (HCC)    with MPA  . Autoimmune disorder (HCC)   . Seizures [facility](HCC)     Patient Active Problem List   Diagnosis Date Noted  . HCAP (healthcare-associated pneumonia) 12/15/2014  . Acute respiratory failure (HCC) 12/15/2014  . 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 Seizures (HCC) 12/15/2014  . Leukopenia 12/15/2014  . Anemia 12/15/2014  . ANCA-associated vasculitis (HCC)     Past Surgical History:  Procedure Laterality Date  . APPENDECTOMY         History reviewed. No pertinent family history.  Social History   Tobacco Use  . Smoking status: Current Every Day Smoker    Packs/day: 0.50    Years: 50.00    Pack years: 25.00    Types: Cigarettes  . Smokeless tobacco: Never Used  Substance Use Topics  . Alcohol use: No  . Drug use: No    Home Medications Prior to Admission medications   Medication Sig Start Date End Date Taking? Authorizing Provider  albuterol (PROVENTIL HFA;VENTOLIN HFA) 108 (90 BASE) MCG/ACT inhaler Inhale 2 puffs into the lungs every 6 (six) hours as needed for wheezing or shortness of breath. 12/18/14   Mikhail,  Nita SellsMaryann, DO  atovaquone [facility](MEPRON) 750 MG/5ML suspension Take 1,500 mg by mouth daily.    [provider]  furosemide (LASIX) 40 MG tablet Take 40 mg by mouth daily.    [provider]  guaiFENesin-dextromethorphan (ROBITUSSIN DM) 100-10 MG/5ML syrup Take 10 mLs by mouth every 6 (six) hours as needed for cough. 12/18/14   Edsel PetrinMikhail, Maryann, DO  lamoTRIgine (LAMICTAL) 200 MG tablet Take 400 mg by mouth 2 (two) times daily.    [provider]  levofloxacin (LEVAQUIN) 750 MG tablet Take 1 tablet (750 mg total) by mouth every other day. 12/18/14   Mikhail, Nita SellsMaryann, DO  lisinopril (PRINIVIL,ZESTRIL) 5 MG tablet Take 5 mg by mouth daily.    [provider]  LORazepam (ATIVAN) 1 MG tablet Take 1 mg by mouth 2 (two) times daily as needed for anxiety.    [provider]  Multiple Vitamins-Minerals (MULTIVITAMIN WITH MINERALS) tablet Take 1 tablet by mouth daily.    [provider]  Nutritional Supplements (FEEDING SUPPLEMENT, NEPRO CARB STEADY,) LIQD Take 237 mLs by mouth 2 (two) times daily between meals. 12/18/14   Mikhail, Nita SellsMaryann, DO  omeprazole (PRILOSEC) 40 MG capsule Take 40 mg by mouth daily.    [provider]  oxybutynin (DITROPAN) 5 MG tablet Take 5 mg by mouth daily.    [provider]  polyethylene glycol (MIRALAX / GLYCOLAX) packet Take 17 g by mouth daily. 12/18/14   Mikhail,  Maryann, DO  propranolol (INDERAL) 80 MG tablet Take 80 mg by mouth daily.    [provider]  sertraline (ZOLOFT) 50 MG tablet Take 50 mg by mouth 2 (two) times daily.    [provider]  simvastatin (ZOCOR) 40 MG tablet Take 40 mg by mouth daily.    [provider]  tamsulosin (FLOMAX) 0.4 MG CAPS capsule Take 0.4 mg by mouth daily after breakfast.    [provider]  zolpidem (AMBIEN) 10 MG tablet Take 10 mg by mouth at bedtime as needed for sleep.    [provider]    Allergies    Patient has no known  allergies.  Review of Systems   Review of Systems  Constitutional: Positive for chills, diaphoresis, fatigue and fever.  HENT: Positive for congestion.   Respiratory: Positive for cough and chest tightness. Negative for wheezing.   Cardiovascular: Positive for chest pain. Negative for palpitations and leg swelling.  Gastrointestinal: Positive for diarrhea, nausea and vomiting. Negative for abdominal pain and constipation.  Genitourinary: Positive for decreased urine volume, dysuria and flank pain. Negative for frequency.  Musculoskeletal: Negative for back pain and myalgias.  Skin: Negative for rash and wound.  Neurological: Positive for light-headedness. Negative for dizziness, syncope, weakness, numbness and headaches.  Psychiatric/Behavioral: Negative for agitation and confusion.  All other systems reviewed and are negative.   Physical Exam Updated Vital Signs Pulse (!) 101   Temp (!) 102.8 F (39.3 C) (Rectal)   Resp (!) 23   SpO2 97%   Physical Exam Vitals and nursing note reviewed.  Constitutional:      General: He is not in acute distress.    Appearance: He is well-developed. He is ill-appearing. He is not toxic-appearing or diaphoretic.  HENT:     Head: Normocephalic and atraumatic.     Nose: No congestion.     Mouth/Throat:     Mouth: Mucous membranes are dry.     Pharynx: No oropharyngeal exudate or posterior oropharyngeal erythema.  Eyes:     Extraocular Movements: Extraocular movements intact.     Conjunctiva/sclera: Conjunctivae normal.     Pupils: Pupils are equal, round, and reactive to light.  Cardiovascular:     Rate and Rhythm: Regular rhythm. Tachycardia present.     Heart sounds: No murmur heard.   Pulmonary:     Effort: No respiratory distress.     Breath sounds: Rhonchi present. No wheezing or rales.  Chest:     Chest wall: No tenderness.  Abdominal:     General: Abdomen is flat.     Palpations: Abdomen is soft.     Tenderness: There is no  abdominal tenderness. There is no right CVA tenderness, left CVA tenderness, guarding or rebound.  Musculoskeletal:        General: No tenderness or signs of injury.     Cervical back: Neck supple. No tenderness.     Right lower leg: No edema.     Left lower leg: No edema.  Skin:    General: Skin is warm and dry.     Capillary Refill: Capillary refill takes less than 2 seconds.     Findings: No erythema.  Neurological:     General: No focal deficit present.     Mental Status: He is alert.     Sensory: No sensory deficit.     Motor: No weakness.  Psychiatric:        Mood and Affect: Mood normal.  ED Results / Procedures / Treatments   Labs (all labs ordered are listed, but only abnormal results are displayed) Labs Reviewed  COMPREHENSIVE METABOLIC PANEL - Abnormal; Notable for the following components:      Result Value   Sodium 133 (*)    Glucose, Bld 162 (*)    Creatinine, Ser 1.81 (*)    Calcium 8.6 (*)    Total Protein 5.5 (*)    Albumin 3.0 (*)    GFR, Estimated 40 (*)    All other components within normal limits  CBC WITH DIFFERENTIAL/PLATELET - Abnormal; Notable for the following components:   WBC 13.5 (*)    RBC 3.66 (*)    Hemoglobin 11.2 (*)    HCT 34.8 (*)    Neutro Abs 12.5 (*)    Lymphs Abs 0.4 (*)    Abs Immature Granulocytes 0.11 (*)    All other components within normal limits  PROTIME-INR - Abnormal; Notable for the following components:   Prothrombin Time 15.9 (*)    INR 1.3 (*)    All other components within normal limits  URINALYSIS, ROUTINE W REFLEX MICROSCOPIC - Abnormal; Notable for the following components:   APPearance HAZY (*)    All other components within normal limits  TROPONIN I (HIGH SENSITIVITY) - Abnormal; Notable for the following components:   Troponin I (High Sensitivity) 38 (*)    All other components within normal limits  TROPONIN I (HIGH SENSITIVITY) - Abnormal; Notable for the following components:   Troponin I (High  Sensitivity) 35 (*)    All other components within normal limits  RESP PANEL BY RT-PCR (FLU A&B, COVID) ARPGX2  CULTURE, BLOOD (ROUTINE X 2)  CULTURE, BLOOD (ROUTINE X 2)  URINE CULTURE  LACTIC ACID, PLASMA  LACTIC ACID, PLASMA  APTT  LIPASE, BLOOD  AMMONIA  TSH    EKG EKG Interpretation  Date/Time:  Monday Feb 07 2021 16:29:08 EDT Ventricular Rate:  88 PR Interval:  149 QRS Duration: 145 QT Interval:  382 QTC Calculation: 463 R Axis:   83 Text Interpretation: Sinus rhythm Right bundle branch block No prior ECG for comparison No STEMI Confirmed by Theda Belfast ([id number]) on 02/07/2021 4:35:03 PM   Radiology DG Chest Port 1 View  Result Date: 02/07/2021 CLINICAL DATA:  Cough and fevers EXAM: PORTABLE CHEST 1 VIEW COMPARISON:  12/15/2014 FINDINGS: Cardiac shadow is stable. Loop recorder is noted. Aortic calcifications are seen. The lungs are well aerated bilaterally with some suggestion of early infiltrate in the right lung base. No bony abnormality is noted. IMPRESSION: Increased opacity in the right lung base suggestive of early infiltrate. Electronically Signed   By: Alcide Clever M.D.   On: 02/07/2021 16:14    Procedures Procedures 35  CRITICAL CARE Performed by: Canary Brim Arianna Delsanto Total critical care time: 35 minutes Critical care time was exclusive of separately billable procedures and treating other patients. Critical care was necessary to treat or prevent imminent or life-threatening deterioration. Critical care was time spent personally by me on the following activities: development of treatment plan with patient and/or surrogate as well as nursing, discussions with consultants, evaluation of patient's response to treatment, examination of patient, obtaining history from patient or surrogate, ordering and performing treatments and interventions, ordering and review of laboratory studies, ordering and review of radiographic studies, pulse oximetry and re-evaluation of  patient's condition.   Medications Ordered in ED Medications  lactated ringers infusion ( Intravenous New Bag/Given 02/07/21 1820)  sodium chloride 0.9 %  bolus 1,000 mL (0 mLs Intravenous Stopped 02/07/21 1753)    And  sodium chloride 0.9 % bolus 1,000 mL (0 mLs Intravenous Stopped 02/07/21 1753)    And  sodium chloride 0.9 % bolus 250 mL (0 mLs Intravenous Stopped 02/07/21 1704)  ceFEPIme (MAXIPIME) 2 g in sodium chloride 0.9 % 100 mL IVPB (0 g Intravenous Stopped 02/07/21 1705)  metroNIDAZOLE (FLAGYL) IVPB 500 mg (0 mg Intravenous Stopped 02/07/21 1753)  vancomycin (VANCOREADY) IVPB 1500 mg/300 mL (1,500 mg Intravenous New Bag/Given 02/07/21 1822)    ED Course  I have reviewed the triage vital signs and the nursing notes.  Pertinent labs & imaging results that were available during my care of the patient were reviewed by me and considered in my medical decision making (see chart for details).    MDM Rules/Calculators/A&P                          Sulayman Manning is a 70 y.o. 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  According to patient and family, they are traveling through the area from [place] back to [place] and stopped to visit some family.  They went to dinner on Saturday and since then he has been feeling gradually worse.  Therefore he is vaccinated and boosted but are unsure of any focal COVID contacts.  Patient started having nausea, vomiting, diarrhea.  He denies any blood in his emesis or blood in his stools but has been very watery.  He has decreased oral intake.  Family is concerned that he could be having a flare of his ANCA vasculitis although previously he had purpura all over him which she does not have  today.  Patient is describing some urinary symptoms with some darkened and decreased urine.  He does say yes to some mild dysuria.  He is denying any pain in his head, neck, or back.  He is denying any abdominal pain now but does report he had some chest discomfort earlier.  He said it was a pressure and tightness.  He was having shortness of breath and cough and is on oxygen during my initial evaluation.  Patient's vital signs on arrival are septic with a temperature 102.8 rectally, tachycardia, tachypnea, and soft blood pressures in the 90s.  Patient was already given fluids with EMS as well as Tylenol.  Family and patient deny any trauma and denies the other complaints.  They are very concerned about the patient.  On exam, lungs have some coarseness bilaterally.  Chest and abdomen are nontender.  No flank tenderness on my exam.  No rashes seen on my exam.  Abdomen was nontender.  Normal bowel sounds.  No focal neurologic deficits initially.  Normal sensation and strength in extremities.  Good pulses in extremities.  No rashes seen on extremities.  Pupils are symmetric reactive normal extraocular movements.  Patient did get lightheaded when I set him up listen to his lungs.  Clinically I am concerned the patient is septic from infection.  We will start him on antibiotics and fluids.  We will get x-ray, urinalysis, and checking for COVID and influenza.  Anticipate admission after work-up is completed.  Patient's x-ray does show evidence of opacity concerning for pneumonia.  Given patient's vital signs,  suspect sepsis from pneumonia.  Urinalysis does not show infection and his flu and COVID test negative.  Troponin was slightly elevated and remained stable.  Creatinine is more elevated, will discuss with admitting team if they suspect this is just from dehydration with his decreased oral intake, nausea vomiting, and diarrhea versus recurrent vasculitis.  Patient be admitted for sepsis with pneumonia on  oxygen.    Final Clinical Impression(s) / ED Diagnoses Final diagnoses:  Sepsis due to pneumonia [facility])     Clinical Impression: 1. Sepsis due to pneumonia [facility])     Disposition: Admit  This note was prepared with assistance of Dragon voice recognition software. Occasional wrong-word or sound-a-like substitutions may have occurred due to the inherent limitations of voice recognition software.     Cierra Rothgeb, Canary Brim, MD 02/08/21 0010

## 2021-02-07 NOTE — Progress Notes (Signed)
Elink following for code sepsis 

## 2021-02-07 NOTE — H&P (Signed)
History and Physical    PLEASE NOTE THAT DRAGON DICTATION SOFTWARE WAS USED IN THE CONSTRUCTION OF THIS NOTE.   Author [place]ad KZS:[id number] DOB: [birth year] DOA: 02/07/2021  PCP: Pcp, No Patient coming from: home   I have personally briefly reviewed patient's old medical records in [place]  Chief Complaint: Shortness of breath  HPI: Ryan Gentry is a 70 y.o. male with medical history significant for ANCA associated vasculitis, seizure disorder, generalized anxiety disorder, urinary incontinence, hyperlipidemia, who is admitted to [facility] on 02/07/2021 with severe sepsis due to right lower lobe pneumonia after presenting from home to [facility] ED complaining of shortness of breath.   The patient reports 2 days of progressive shortness of breath in the absence of any orthopnea, PND, or peripheral edema.  He notes associated subjective fever and chills over that timeframe, in the absence of any full body rigors or generalized myalgias.  He also notes new onset nonproductive cough over the last 2 days in the absence of any hemoptysis.  He notes associated nausea resulting in 4-5 episodes of nonbloody, nonbilious emesis over the last 2 days, with most recent episode occurring just prior to presenting to [facility] ED today.  He is also had 3-4 episodes of loose stool over the last 2 days, he reports that this is watery in nature, and denies any associated melena or hematochezia.  Denies associated abdominal pain, rash, or purpura.  No known recent COVID-19 exposures.  Shortness of breath has not been associated with any recent chest pain, diaphoresis, palpitations, presyncope, syncope, or dizziness.  Denies any recent wheezing.  Not associate with any headache, neck stiffness, rhinorrhea, rhinitis.  He also denies any recent dysuria, gross hematuria, or change in urinary urgency/frequency.  In the context of his recent nausea/vomiting, the patient acknowledges significant decline in his oral  intake of both food and fluids over the last 2 days.  He has been traveling with his wife from [place] in route to [place].  They recently stopped in [place] to visit loved ones, when the above constellation of symptoms started on Saturday, 02/05/2021.  He denies any personal or family history of DVT/PE.  No recent calf tenderness or lower extremity erythema.  No recent trauma or surgical procedures.  The patient reports a history of ANCA associated vasculitis, for which he is on chronic immunosuppression via CellCept.  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  In terms of assessing kidney function by chart review, it appears that in 2016 the patient's creatinine was 1.6-1.7, with subsequent serum creatinine data point noted to be 1.6 in June 2021.   On the basis of progression of the above symptoms, the patient contacted EMS earlier today, who reportedly noted the patient's oxygen saturations to be in the 80s on room air.  He was placed on nasal cannula and brought to [facility] emergency department for further evaluation.  He denies any known baseline supplemental oxygen requirements.     ED Course:  Vital signs in the ED were notable for the following: Temperature max 102.8, heart rate 92-1 10; initial blood pressure 97/56, which is improved at 732/20 folic interval administration of IV fluids, as further quantified below; respiratory rate 17-23, oxygen saturation has been 95 to 96% on 4 L nasal cannula in the ED.  Labs were notable for the following: CMP was notable for the following: Sodium 133, bicarbonate 22, BUN 23, creatinine 1.81.  High-sensitivity troponin I  initially noted to be 38, which subsequently trended down to 35.  CBC notable for white blood cell count 13,500 with 93% neutrophils.  Initial lactate 1.4, which subsequent trend down to 1.2.  Urinalysis showed no white blood cells, nitrate negative, leukocyte esterase  negative.  Nasopharyngeal COVID-19/influenza PCR was checked in the ED today, and found to be negative.  Blood cultures x2 were collected prior to initiation of antibiotics.  EKG showed sinus rhythm with heart rate 88, right bundle branch block, QRS 145, and no evidence of T wave or ST changes, including no evidence of ST elevation.  Chest x-ray showed right lower lobe infiltrate suggestive of infiltrate in the absence of any evidence of edema, pleural effusion, or pneumothorax.  While in the ED, the following were administered: Cefepime, IV vancomycin, IV Flagyl, normal saline times 2,250 cc bolus, followed by initiation of continuous lactated Ringer's at 150 cc/h.  Subsequently, the patient was admitted to the med telemetry floor for further evaluation management of presenting severe sepsis due to right lower lobe pneumonia as well as acute hypoxic respiratory failure suspected to be on this basis.    Review of Systems: As per HPI otherwise 10 point review of systems negative.   Past Medical History:  Diagnosis Date  . 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 Autoimmune disorder ([place])   . Seizures ([place])     Past Surgical History:  Procedure Laterality Date  . APPENDECTOMY      Social History:  reports that he has been smoking cigarettes. He has a 25.00 pack-year smoking history. He has never used smokeless tobacco. He reports that he does not drink alcohol and does not use drugs.   Allergies  Allergen Reactions  . Citrullus Vulgaris Nausea And Vomiting    Other reaction(s): Upper Airway Edema  . Cucumber Extract Nausea And Vomiting    Other reaction(s): Upper Airway Edema    Family history reviewed and not pertinent    Prior to Admission medications   Medication Sig Start Date End Date Taking? Authorizing Provider  albuterol (PROVENTIL HFA;VENTOLIN HFA) 108 (90 BASE) MCG/ACT inhaler Inhale 2 puffs into the lungs every 6 (six) hours as needed for wheezing or shortness  of breath. 12/18/14  Yes Mikhail, [place], DO  calcium-vitamin D (OSCAL WITH D) 500-200 MG-UNIT TABS tablet Take 1 tablet by mouth in the morning and at bedtime.   Yes [provider]  ferrous sulfate 325 (65 FE) MG tablet Take 325 mg by mouth in the morning and at bedtime. 03/10/19  Yes [provider]  finasteride (PROSCAR) 5 MG tablet Take 5 mg by mouth daily.   Yes [provider]  gabapentin (NEURONTIN) 300 MG capsule Take 600 mg by mouth daily. 02/01/21  Yes [provider]  hydrochlorothiazide (HYDRODIURIL) 12.5 MG tablet Take 12.5 mg by mouth daily. 02/01/21  Yes [provider]  HYDROcodone-acetaminophen (NORCO/VICODIN) 5-325 MG tablet Take 1 tablet by mouth every 4 (four) hours as needed for moderate pain. 12/04/20  Yes [provider]  ibuprofen (ADVIL) 200 MG tablet Take 400 mg by mouth every 6 (six) hours as needed for headache or moderate pain.   Yes [provider]  lamoTRIgine (LAMICTAL) 200 MG tablet Take 400 mg by mouth 2 (two) times daily.   Yes [provider]  lisinopril (ZESTRIL) 2.5 MG tablet Take 2.5 mg by mouth daily.   Yes [provider]  LORazepam (ATIVAN) 1 MG tablet Take 1 mg by mouth  2 (two) times daily as needed for anxiety.   Yes [provider]  metoprolol tartrate (LOPRESSOR) 25 MG tablet Take 37.5 mg by mouth in the morning and at bedtime. 03/19/20  Yes [provider]  Multiple Vitamins-Minerals (CENTRUM SILVER PO) Take 1 tablet by mouth daily.   Yes [provider]  mycophenolate (CELLCEPT) 500 MG tablet Take 500 mg by mouth 2 (two) times daily. 02/01/21  Yes [provider]  Naphazoline-Pheniramine (VISINE-A OP) Apply 1 drop to eye daily as needed (dry eyes).   Yes [provider]  omeprazole (PRILOSEC) 40 MG capsule Take 40 mg by mouth daily.   Yes [provider]  oxybutynin (DITROPAN) 5 MG tablet Take 5 mg by mouth daily.   Yes  [provider]  sertraline (ZOLOFT) 100 MG tablet Take 100 mg by mouth daily.   Yes [provider]  sertraline (ZOLOFT) 25 MG tablet Take 25 mg by mouth daily.   Yes [provider]  simvastatin (ZOCOR) 40 MG tablet Take 40 mg by mouth daily.   Yes [provider]  tamsulosin (FLOMAX) 0.4 MG CAPS capsule Take 0.4 mg by mouth daily after breakfast.   Yes [provider]  vitamin C (ASCORBIC ACID) 500 MG tablet Take 500 mg by mouth 2 (two) times daily.   Yes [provider]     Objective    Physical Exam: Vitals:   02/07/21 2015 02/07/21 2030 02/07/21 2045 02/07/21 2052  BP: 130/79 136/73 119/77   Pulse: (!) 110 (!) 111 (!) 120 (!) 110  Resp: 19  20 (!) 21  Temp:      TempSrc:      SpO2: 95% 96% 92% 96%  Weight:      Height:        General: appears to be stated age; alert, oriented; increased work of breathing noted Skin: warm, dry, no rash Head:  AT/[place] Mouth:  Oral mucosa membranes appear dry, normal dentition Neck: supple; trachea midline Heart: Mildly tachycardic, but regular; did not appreciate any M/R/G Lungs: right basilar rales noted; did not appreciate any wheezes or rhonchi Abdomen: + BS; soft, ND, NT Vascular: 2+ pedal pulses b/l; 2+ radial pulses b/l; no purpura Extremities: no peripheral edema, no muscle wasting; no purpura.  Neuro: strength and sensation intact in upper and lower extremities b/l    Labs on Admission: I have personally reviewed following labs and imaging studies  CBC: Recent Labs  Lab 02/07/21 1620  WBC 13.5*  NEUTROABS 12.5*  HGB 11.2*  HCT 34.8*  MCV 95.1  PLT 594   Basic Metabolic Panel: Recent Labs  Lab 02/07/21 1620  NA 133*  K 3.6  CL 102  CO2 22  GLUCOSE 162*  BUN 23  CREATININE 1.81*  CALCIUM 8.6*   GFR: Estimated Creatinine Clearance: 36.5 mL/min (A) (by C-G formula based on SCr of 1.81 mg/dL (H)). Liver Function Tests: Recent Labs  Lab 02/07/21 1620  AST  17  ALT 14  ALKPHOS 90  BILITOT 0.7  PROT 5.5*  ALBUMIN 3.0*   Recent Labs  Lab 02/07/21 1620  LIPASE 31   Recent Labs  Lab 02/07/21 1715  AMMONIA 24   Coagulation Profile: Recent Labs  Lab 02/07/21 1620  INR 1.3*   Cardiac Enzymes: No results for input(s): CKTOTAL, CKMB, CKMBINDEX, TROPONINI in the last 168 hours. BNP (last 3 results) No results for input(s): PROBNP in the last 8760 hours. HbA1C: No results for input(s): HGBA1C in the  last 72 hours. CBG: No results for input(s): GLUCAP in the last 168 hours. Lipid Profile: No results for input(s): CHOL, HDL, LDLCALC, TRIG, CHOLHDL, LDLDIRECT in the last 72 hours. Thyroid Function Tests: No results for input(s): TSH, T4TOTAL, FREET4, T3FREE, THYROIDAB in the last 72 hours. Anemia Panel: No results for input(s): VITAMINB12, FOLATE, FERRITIN, TIBC, IRON, RETICCTPCT in the last 72 hours. Urine analysis:    Component Value Date/Time   COLORURINE YELLOW 02/07/2021 1820   APPEARANCEUR HAZY (A) 02/07/2021 1820   LABSPEC 1.012 02/07/2021 1820   PHURINE 5.0 02/07/2021 1820   GLUCOSEU NEGATIVE 02/07/2021 1820   HGBUR NEGATIVE 02/07/2021 1820   BILIRUBINUR NEGATIVE 02/07/2021 1820   KETONESUR NEGATIVE 02/07/2021 1820   PROTEINUR NEGATIVE 02/07/2021 1820   NITRITE NEGATIVE 02/07/2021 1820   LEUKOCYTESUR NEGATIVE 02/07/2021 1820    Radiological Exams on Admission: DG Chest Port 1 View  Result Date: 02/07/2021 CLINICAL DATA:  Cough and fevers EXAM: PORTABLE CHEST 1 VIEW COMPARISON:  12/15/2014 FINDINGS: Cardiac shadow is stable. Loop recorder is noted. Aortic calcifications are seen. The lungs are well aerated bilaterally with some suggestion of early infiltrate in the right lung base. No bony abnormality is noted. IMPRESSION: Increased opacity in the right lung base suggestive of early infiltrate. Electronically Signed   By: Inez Catalina M.D.   On: 02/07/2021 16:14     EKG: Independently reviewed, with result as  described above.    Assessment/Plan   Arlando Leisinger is a 70 y.o. male with medical history significant for ANCA associated vasculitis, seizure disorder, generalized anxiety disorder, urinary incontinence, hyperlipidemia, who is admitted to [facility] on 02/07/2021 with severe sepsis due to right lower lobe pneumonia after presenting from home to [facility] ED complaining of shortness of breath.   Principal Problem:   Severe sepsis ([place]) Active Problems:   Acute respiratory failure with hypoxia (HCC)   Seizures (HCC)   CAP (community acquired pneumonia)   Nausea & vomiting   Hyponatremia   Elevated troponin   GAD (generalized anxiety disorder)   Urinary incontinence   BPH (benign prostatic hyperplasia)   Tobacco abuse     #) Severe sepsis due to right lower lobe pneumonia: Diagnosis on the basis of 2 days of progressive shortness of breath associated with new onset nonproductive cough, chills, subjective fever, with presenting chest x-ray demonstrating right lower lobe infiltrate concerning for pneumonia.  SIRS criteria met via presenting objective fever, leukocytosis, tachycardia, and tachypnea.  Patient sepsis meets criteria to be considered severe nature on the basis of associated acute hypoxic respiratory failure, as further described below.  Initial blood pressure was borderline hypotensive, with initial BP noted to be 97/56.  In light of this, the patient has received greater than 30 mL/kg IVF bolus relative to his presenting body weight of 68 kg, with ensuing improvement in blood pressure into the systolic 201E to 071Q.  Of note, patient was chronically immune suppressed on CellCept in the context of his ANCA associated vasculitis.  Additionally, in the setting of associated gastrointestinal symptoms as well as hyponatremia, differential also includes Legionella, although radiographically, patient's pneumonia appears less consistent with this possibility.  While his pneumonia is  technically community-acquired in nature in the absence of any recent hospitalizations, will continue broad-spectrum IV antibiotics given concomitant severe sepsis as well as his immunocompromise status given chronic CellCept use.  Blood cultures x2 collected in the ED prior to initiation of IV vancomycin, cefepime, Flagyl.  We will continue IV Vanco and cefepime  for now, and add azithromycin for atypical pneumonia coverage.  We will hold Flagyl at this time, given lower index of suspicion for anaerobic infection, pulmonology and that the patient has had some loose stool over the last 2 days.  We will check stool GI infectious panel by PCR.  No recent antibiotic use, abdominal pain, or severe leukocytosis to raise index of suspicion for underlying C. difficile colitis at this time.  No other source of underlying infectious process has been identified at this time, including urinalysis, which was nonsuggestive of UTI, while COVID-19/influenza PCR checked earlier today were found to be negative.  Plan: Continuous lactated Ringer's at 125 cc/h given intermittent nausea/vomiting.  Follow-up results of blood cultures x2.  Continue IV vancomycin, cefepime, and at azithromycin, as above.  Add on procalcitonin.  Repeat CBC with differential in the morning.  Check Legionella urine antigen as well as strep pneumoniae urine antigen.  Add on mycoplasma antibodies. Flutter. Stool GI PCR panel.  As needed acetaminophen for fever.  Monitor on telemetry.  Monitor continuous pulse oximetry.  As needed albuterol nebulizer.  Check MRSA PCR, with plan to discontinue vancomycin was found to be negative given the high negative predictive value for MRSA pneumonia associated with such a finding.     #) Acute hypoxic respiratory failure: in the context of no known baseline supplemental oxygen requirements, presenting O2 sat reported to be in the mid 80s on room air as found by EMS, with ensuing improvement in O2 sats into the 90s on  4 L nasal cannula. No known chronic underlying pulmonary conditions.  This appears to have basis of presenting severe sepsis due to right lower lobe pneumonia, as further described above.  ACS is felt to be less likely at this time in the absence of any recent chest pain, context of presenting EKG showing no evidence of acute ischemic changes.  Chest x-ray, showed evidence of right lower lobe infiltrate consistent with pneumonia, but otherwise showed no evidence of acute cardiopulmonary process, including no evidence of edema, effusion, or pneumothorax.  Overall, no clinical or radiographic evidence to suggest acute decompensated heart failure at this time.  Additionally, COVID-19/influenza PCR found to be negative when checked today.  Clinically, presentation is listed as of acute pulmonary embolism at this time.   Plan: further evaluation and management of presenting severe sepsis due to right lower lobe pneumonia, as above, including monitoring of continuous pulse oximetry with prn supplemental O2 to maintain O2 sats greater than or equal to 92%. monitor on telemetry. Check CMP and CBC in the morning. Check serum Mg and Phos levels.  In the context of the patient's smoking history, will also check VBG to evaluate for any element of hypercapnia.  Flutter valve.  As needed albuterol nebulizer ordered.  Monitor strict I's and O's and daily weights.       #) Nausea/vomiting: 4/5 episodes of nonbloody, nonbilious emesis over the last 2 days, with patient reporting some residual nausea at this time.  Most recent episode of emesis occurred just prior to presenting to the ED today.   Plan: As needed IV Ativan ordered.  Continues lactated Ringer's, as above.  Add on serum magnesium level.  Repeat CMP in the morning.       #) Acute hypo-osmolar hypovolemic hyponatremia: Presenting serum sodium noted to be 133.  Suspect that this is hypoosmolar hypovolemic in nature given recent GI losses in the form of  nausea, vomiting, diarrhea over the last 2 days, and exacerbated  very limited p.o. intake over that time.  There may also be a pharmacologic contribution from HCTZ.  Differential also includes SIADH given presenting acute pulmonary pathology in the form of presenting community-acquired pneumonia.  We will provide additional IV fluids to attend to strongly suspect element of hypovolemia, will further evaluating for any additional contribution from SIADH, as further detailed below.  Of note, TSH was checked in the ED, with result currently pending. Will check Legionella urine ag given presenting pna a/w GI symptoms and hyponatremia.     Plan: monitor strict I's and O's and daily weights.  Continuous lactated Ringer's, as above.  Add on random urine sodium, urine osmolality.  Check serum osmolality to confirm suspected hypoosmolar etiology..  Follow-up result of TSH, as above.  Repeat BMP in the morning.  Hold home HCTZ. Check legionella urine ag, as above.      #) Elevated creatinine.  Suspect that the patient has stage IIIa chronic kidney disease given review of most recent prior creatinine data points, which included range of 1.6-1.7 in 2016, as well as more recent value of 1.6 in June 2021.  It is possible that he is limited prior data points may have represented.  Acute kidney injury at the time of the collection.  We will attempt additional chart review to evaluate any additional available creatinine data points to further establish baseline renal function.  Of note, patient's creatinine found to be 1.81, relatively consistent with these previous values.   Plan: Monitor strict I's and O's and daily weights.  Tempt avoid nephrotoxic agents.  Check random urine sodium as well as random urine creatinine.  Repeat BMP in the morning.       #) Elevated troponin: mildly elevated initial troponin of 38, which has subsequently trended down to 35. Suspect that this mildly elevated troponin is on the  basis of supply demand mismatch in the setting of acute hypoxic respiratory failure in setting of severe sepsis due to right lower lobe pneumonia , as opposed to representing a type I process due to acute plaque rupture.  EKG shows no evidence of acute schema changes, including no evidence of ST elevation.  Chest x-ray shows right lower lobe infiltrate consistent with pneumonia, but otherwise shows no evidence of acute cardiopulmonary process, including no evidence of edema or pneumothorax.  Additionally, presentation is not associate with any chest pain.  Overall, ACS felt to be less likely relative to type II supply demand mismatch, as above, will closely monitor on telemetry overnight while treating suspected underlying acute [place] respiratory failure in the setting of severe sepsis due to right lower lobe pneumonia, as further described above.  Presentation is clinically less suggestive of acute pulmonary embolism.    Plan:  Monitor on telemetry.  Monitor continuous pulse oximetry PRN EKG for development of chest pain.Check serum Mg level and repeat BMP in the morning, with prn supplementation to maintain Mg and potassium levels greater than or equal to 2.0 and 4.0, respectively, to further reduce risk of ventricular arrhythmia. Repeat CBC in the AM. Additional evaluation and management of presenting severe sepsis due to right lower lobe pneumonia and associated acute hypoxic respiratory failure as suspected driving force behind mildly elevated troponin, as above.       #) Seizure disorder: On Lamictal as an outpatient.  No evidence of acute seizure-like activity at this time.   Plan: Continue home Lamictal.      #) Generalized anxiety disorder: On Zoloft as well as as needed  Ativan at home.  Given: Perpetuation of intermittent nausea/vomiting, will hold Zoloft for now.  Additionally, will hold outpatient as needed oral Ativan, and instead have ordered as needed IV Ativan for nausea as well  as breakthrough anxiety.      #) GERD: On omeprazole at home.  Plan: Hold home omeprazole for now in the setting of nausea/vomiting as well as increased risk for associated diarrhea as a consequence of this medication, particularly in light of his recent report of multiple episodes of loose stool.     #) Urinary incontinence: On oxybutynin as an outpatient.  Plan: Hold home oxybutynin for now to reduce any anticholinergic contributions to the patient's presenting tachycardia.  Monitor strict I's and O's and daily weights.  Repeat BMP in the morning.     #) Chronic prostatic hyperplasia: On Flomax as well as finasteride as an outpatient.  Plan: In setting of presenting severe sepsis, will hold home clinics due to potential associated antihypertensive side effects of this medication.  Additionally, given continued intermittent nausea/vomiting, will hold home finasteride for now.     #) Chronic tobacco abuse: Patient confirms that he is a current smoker, having smoked approximately half pack per day over the last 50 years.  Plan: Counseled the patient on the importance of complete smoking discontinuation.     DVT prophylaxis: SCDs Code Status: Full code Family Communication: none Disposition Plan: Per Rounding Team Consults called: none  Admission status: Inpatient; med telemetry     Of note, this patient was added by me to the following Admit List/Treatment Team:  mcadmits.     PLEASE NOTE THAT DRAGON DICTATION SOFTWARE WAS USED IN THE CONSTRUCTION OF THIS NOTE.   Rhetta Mura DO [facility] Pager [phone] From [place]   02/07/2021, 9:50 PM

## 2021-02-07 NOTE — ED Triage Notes (Signed)
Pt BIB [facility] from hotel for fever, N/V/D, and cough that started 2 days ago. Aox4, pt febrile at 101.7, ems gave 1000mg  tylenol. BP 113/60, ems gave NS bolus, 20g RAC.

## 2021-02-07 NOTE — ED Notes (Signed)
RN at bedside, pt had a bowel movement and became short of breath. RN sat pt up in bed, increased O2 to 4L, pt SpO2 remained 93-95%.

## 2021-02-07 NOTE — Progress Notes (Addendum)
Pharmacy Antibiotic Note  Ryan Gentry is a 70 y.o. male admitted on 02/07/2021 with sepsis.  Pharmacy has been consulted for vancomycin and cefepime dosing. 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 On arrival, Tmax 102.8, tachycardic, WBC 13.5, and short of breath. Current Scr is 1.81 (BL~1.50).   Plan: Cefepime 2 G Q12H  Vancomycin 1500 MG x1 followed by 750 MG Q24H (Predicted AUC 441 using Scr 1.81) Monitor clinical status, renal function, and levels as indicated   Height: 5\' 10"  (177.8 cm) Weight: 68 kg (150 lb) IBW/kg (Calculated) : 73  Temp (24hrs), Avg:100.9 F (38.3 C), Min:98.9 F (37.2 C), Max:102.8 F (39.3 C)  Recent Labs  Lab 02/07/21 1620 02/07/21 1716 02/07/21 1820  WBC 13.5*  --   --   CREATININE 1.81*  --   --   LATICACIDVEN  --  1.4 1.2    Estimated Creatinine Clearance: 36.5 mL/min (A) (by C-G formula based on SCr of 1.81 mg/dL (H)).    No Known Allergies  Antimicrobials this admission: 5/2 Cefepime >>  5/2 Vanc >>   Dose adjustments this admission:   Microbiology results: 5/2 BCx>  5/2 UCx>    Thank you for allowing pharmacy to be a part of this patient's care.  7/2, PharmD, MBA Pharmacy Resident [phone] 02/07/2021 8:38 PM

## 2021-02-08 DIAGNOSIS — J189 Pneumonia, unspecified organism: Secondary | ICD-10-CM

## 2021-02-08 DIAGNOSIS — N400 Enlarged prostate without lower urinary tract symptoms: Secondary | ICD-10-CM

## 2021-02-08 LAB — BLOOD GAS, VENOUS
Acid-base deficit: 5.3 mmol/L — ABNORMAL HIGH (ref 0.0–2.0)
Bicarbonate: 18.9 mmol/L — ABNORMAL LOW (ref 20.0–28.0)
O2 Saturation: 77.6 %
Patient temperature: 37
pCO2, Ven: 33.1 mmHg — ABNORMAL LOW (ref 44.0–60.0)
pH, Ven: 7.376 (ref 7.250–7.430)

## 2021-02-08 LAB — TSH: TSH: 2.501 u[IU]/mL (ref 0.350–4.500)

## 2021-02-08 LAB — COMPREHENSIVE METABOLIC PANEL
ALT: 14 U/L (ref 0–44)
AST: 13 U/L — ABNORMAL LOW (ref 15–41)
Albumin: 2.8 g/dL — ABNORMAL LOW (ref 3.5–5.0)
Alkaline Phosphatase: 51 U/L (ref 38–126)
Anion gap: 9 (ref 5–15)
BUN: 23 mg/dL (ref 8–23)
CO2: 18 mmol/L — ABNORMAL LOW (ref 22–32)
Calcium: 7.9 mg/dL — ABNORMAL LOW (ref 8.9–10.3)
Chloride: 107 mmol/L (ref 98–111)
Creatinine, Ser: 1.54 mg/dL — ABNORMAL HIGH (ref 0.61–1.24)
GFR, Estimated: 48 mL/min — ABNORMAL LOW (ref >60)
Glucose, Bld: 138 mg/dL — ABNORMAL HIGH (ref 70–99)
Potassium: 3.4 mmol/L — ABNORMAL LOW (ref 3.5–5.1)
Sodium: 134 mmol/L — ABNORMAL LOW (ref 135–145)
Total Bilirubin: 0.9 mg/dL (ref 0.3–1.2)
Total Protein: 5.3 g/dL — ABNORMAL LOW (ref 6.5–8.1)

## 2021-02-08 LAB — PROCALCITONIN: Procalcitonin: 5.11 ng/mL

## 2021-02-08 LAB — CBC WITH DIFFERENTIAL/PLATELET
Abs Immature Granulocytes: 0.14 10*3/uL — ABNORMAL HIGH (ref 0.00–0.07)
Basophils Absolute: 0 10*3/uL (ref 0.0–0.1)
Basophils Relative: 0 %
Eosinophils Absolute: 0 10*3/uL (ref 0.0–0.5)
Eosinophils Relative: 0 %
HCT: 32.3 % — ABNORMAL LOW (ref 39.0–52.0)
Hemoglobin: 10.5 g/dL — ABNORMAL LOW (ref 13.0–17.0)
Immature Granulocytes: 1 %
Lymphocytes Relative: 3 %
Lymphs Abs: 0.3 10*3/uL — ABNORMAL LOW (ref 0.7–4.0)
MCH: 30.2 pg (ref 26.0–34.0)
MCHC: 32.5 g/dL (ref 30.0–36.0)
MCV: 92.8 fL (ref 80.0–100.0)
Monocytes Absolute: 0.3 10*3/uL (ref 0.1–1.0)
Monocytes Relative: 3 %
Neutro Abs: 9.8 10*3/uL — ABNORMAL HIGH (ref 1.7–7.7)
Neutrophils Relative %: 93 %
Platelets: 139 10*3/uL — ABNORMAL LOW (ref 150–400)
RBC: 3.48 MIL/uL — ABNORMAL LOW (ref 4.22–5.81)
RDW: 12.8 % (ref 11.5–15.5)
WBC: 10.5 10*3/uL (ref 4.0–10.5)
nRBC: 0 % (ref 0.0–0.2)

## 2021-02-08 LAB — HIV ANTIBODY (ROUTINE TESTING W REFLEX): HIV Screen 4th Generation wRfx: NONREACTIVE

## 2021-02-08 LAB — PHOSPHORUS: Phosphorus: 1.9 mg/dL — ABNORMAL LOW (ref 2.5–4.6)

## 2021-02-08 LAB — OSMOLALITY: Osmolality: 289 mOsm/kg (ref 275–295)

## 2021-02-08 LAB — MAGNESIUM
Magnesium: 1.5 mg/dL — ABNORMAL LOW (ref 1.7–2.4)
Magnesium: 1.6 mg/dL — ABNORMAL LOW (ref 1.7–2.4)

## 2021-02-08 LAB — MRSA PCR SCREENING: MRSA by PCR: NEGATIVE

## 2021-02-08 MED ORDER — BUDESONIDE 0.5 MG/2ML IN SUSP
0.5000 mg | Freq: Two times a day (BID) | RESPIRATORY_TRACT | Status: DC
  Administered 2021-02-08 – 2021-02-15 (×14): 0.5 mg via RESPIRATORY_TRACT
  Filled 2021-02-08 (×14): qty 2

## 2021-02-08 MED ORDER — LORAZEPAM 2 MG/ML IJ SOLN
1.0000 mg | Freq: Three times a day (TID) | INTRAMUSCULAR | Status: DC | PRN
  Administered 2021-02-09 – 2021-02-14 (×6): 1 mg via INTRAVENOUS
  Filled 2021-02-08 (×6): qty 1

## 2021-02-08 MED ORDER — SACCHAROMYCES BOULARDII 250 MG PO CAPS
250.0000 mg | ORAL_CAPSULE | Freq: Two times a day (BID) | ORAL | Status: DC
  Administered 2021-02-08 – 2021-02-15 (×15): 250 mg via ORAL
  Filled 2021-02-08 (×16): qty 1

## 2021-02-08 MED ORDER — LACTATED RINGERS IV SOLN: INTRAVENOUS | Status: DC

## 2021-02-08 MED ORDER — IPRATROPIUM-ALBUTEROL 0.5-2.5 (3) MG/3ML IN SOLN
3.0000 mL | Freq: Four times a day (QID) | RESPIRATORY_TRACT | Status: DC | PRN
  Administered 2021-02-09 (×2): 3 mL via RESPIRATORY_TRACT
  Filled 2021-02-08 (×2): qty 3

## 2021-02-08 MED ORDER — MAGNESIUM SULFATE 2 GM/50ML IV SOLN
2.0000 g | Freq: Once | INTRAVENOUS | Status: AC
  Administered 2021-02-08: 2 g via INTRAVENOUS
  Filled 2021-02-08: qty 50

## 2021-02-08 MED ORDER — POTASSIUM CHLORIDE CRYS ER 20 MEQ PO TBCR
40.0000 meq | EXTENDED_RELEASE_TABLET | Freq: Once | ORAL | Status: AC
  Administered 2021-02-08: 40 meq via ORAL
  Filled 2021-02-08: qty 2

## 2021-02-08 MED ORDER — SODIUM PHOSPHATES 45 MMOLE/15ML IV SOLN
20.0000 mmol | Freq: Once | INTRAVENOUS | Status: AC
  Administered 2021-02-08: 20 mmol via INTRAVENOUS
  Filled 2021-02-08: qty 6.67

## 2021-02-08 MED ORDER — DIPHENOXYLATE-ATROPINE 2.5-0.025 MG PO TABS
1.0000 | ORAL_TABLET | Freq: Four times a day (QID) | ORAL | Status: DC | PRN
  Administered 2021-02-08 – 2021-02-10 (×4): 1 via ORAL
  Filled 2021-02-08 (×4): qty 1

## 2021-02-08 MED ORDER — GABAPENTIN 600 MG PO TABS
300.0000 mg | ORAL_TABLET | Freq: Two times a day (BID) | ORAL | Status: DC
  Administered 2021-02-08 – 2021-02-15 (×15): 300 mg via ORAL
  Filled 2021-02-08 (×15): qty 1

## 2021-02-08 NOTE — Progress Notes (Signed)
PROGRESS NOTE    Ryan Gentry  CVE:[id number] DOB: 1951/10/05 DOA: 02/07/2021  PCP: Pcp, No   Chief Complaint  Patient presents with  . Cough  . Fever  . Nausea    Brief Narrative:  Ryan Gentry is a 70 y.o. male with medical history significant for ANCA associated vasculitis, seizure disorder, generalized anxiety disorder, urinary incontinence, hyperlipidemia, who is admitted to [facility] on 02/07/2021 with severe sepsis due to right lower lobe pneumonia after presenting from home to [facility] ED complaining of shortness of breath.   The patient reports 2 days of progressive shortness of breath in the absence of any orthopnea, PND, or peripheral edema.  He notes associated subjective fever and chills over that timeframe, in the absence of any full body rigors or generalized myalgias.  He also notes new onset nonproductive cough over the last 2 days in the absence of any hemoptysis.  He notes associated nausea resulting in 4-5 episodes of nonbloody, nonbilious emesis over the last 2 days, with most recent episode occurring just prior to presenting to [facility] ED today.  He is also had 3-4 episodes of loose stool over the last 2 days, he reports that this is watery in nature, and denies any associated melena or hematochezia.  Denies associated abdominal pain, rash, or purpura.  No known recent COVID-19 exposures.  Shortness of breath has not been associated with any recent chest pain, diaphoresis, palpitations, presyncope, syncope, or dizziness.  Denies any recent wheezing.  Not associate with any headache, neck stiffness, rhinorrhea, rhinitis.  He also denies any recent dysuria, gross hematuria, or change in urinary urgency/frequency.  In the context of his recent nausea/vomiting, the patient acknowledges significant decline in his oral intake of both food and fluids over the last 2 days.  He has been traveling with his wife from [place] in route to [place].  They recently stopped in [place]  to visit loved ones, when the above constellation of symptoms started on Saturday, 02/05/2021.  He denies any personal or family history of DVT/PE.  No recent calf tenderness or lower extremity erythema.  No recent trauma or surgical procedures.  The patient reports a history of ANCA associated vasculitis, for which he is on chronic immunosuppression via CellCept.  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  In terms of assessing kidney function by chart review, it appears that in 2016 the patient's creatinine was 1.6-1.7, with subsequent serum creatinine data point noted to be 1.6 in June 2021.   On the basis of progression of the above symptoms, the patient contacted EMS earlier today, who reportedly noted the patient's oxygen saturations to be in the 80s on room air.  He was placed on nasal cannula and brought to [facility] emergency department for further evaluation.  He denies any known baseline supplemental oxygen requirements.   Assessment & Plan: 1-severe sepsis [facility]): Secondary to pneumonia -Negative MRSA PCR; vancomycin has been discontinued -Continue cefepime and Zithromax -Continue to follow culture results -start Pulmicort and as needed DuoNeb -Continue oxygen supplementation and wean off as tolerated -Start the use of flutter valve and incentive respirometer -follow clinical response.  2-Acute respiratory failure with hypoxia (HCC) -in the setting of PNA -continue treatment as mentioned above -wean off oxygen supplementation as tolerated  3-Seizures (HCC) -continue lamictal and neurontin -PRN ativan and seizure precautions ordered  4-Nausea & vomiting -continue PRN antiemetics  5-Hyponatremia -will follow electrolytes and replete as needed -in  the setting of dehydration, GI loses and poor oral intake -continue IVF's -holding HCTZ for now  6-Elevated troponin -no CP -continue telemetry monitoring    7-GAD (generalized anxiety disorder) -continue ativan PRN  8-diarrhea -started on florastor -stools studies ordered  -PRN lomotil   9-BPH (benign prostatic hyperplasia) -continue proscar and flomax  10-ANCA associated vasculitis  -continue cellcept -continue outpatient follow up with rheumatology   11-HTN -continue lisinopril and metoprolol    DVT prophylaxis: SCDs Code Status: Full code Family Communication: No family at bedside; unable to reach wife over the phone. Disposition:   Status is: Inpatient  Dispo: The patient is from: home               Anticipated d/c is to: to be determined.              Patient currently not medically stable for discharge; with increase difficulty breathing, spiking fever and requiring oxygen supplementation (new). He also experienced seizure event and is having diarrhea.    Difficult to place patient: no      Consultants:   None   Procedures:  See below for x-ray reports   Antimicrobials:  Vancomycin 02/07/21>>02/08/21 Cefepime 02/07/21 Zithromax 02/07/21   Subjective: Low-grade temperature appreciated overnight; reporting short episode of petit mall seizure; no chest pain, no nausea, no vomiting.  Having difficulty speaking in full sentences, appreciable wheezing on examination and requiring 5 L nasal cannula supplementation.  Per nursing report more than 6 loose stools episodes since admission.   Objective: Vitals:   02/08/21 0500 02/08/21 0533 02/08/21 0710 02/08/21 0856  BP:  121/72 115/61 136/89  Pulse:  96 90 (!) 109  Resp:  19 18   Temp:  98.9 F (37.2 C) 98.5 F (36.9 C)   TempSrc:  Oral Oral   SpO2:  95% 96%   Weight: 70.8 kg     Height:        Intake/Output Summary (Last 24 hours) at 02/08/2021 1242 Last data filed at 02/08/2021 0930 Gross per 24 hour  Intake 4777.59 ml  Output 975 ml  Net 3802.59 ml   Filed Weights   02/07/21 1813 02/07/21 2309 02/08/21 0500  Weight: 68 kg 70.3 kg 70.8 kg     Examination:  General exam: Low-grade temperature overnight; no chest pain, no nausea, no vomiting.  Demonstrating difficulty speaking in full sentences, complaining of intermittent coughing spells and is requiring 5 L oxygen supplementation. Respiratory system: Plus respiratory wheezing, no using accessory muscle.  Positive bilateral rhonchi. Cardiovascular system: S1-S2; no rubs, no gallops, no JVD on exam. Gastrointestinal system: Abdomen is nondistended, soft and nontender. No organomegaly or masses felt. Normal bowel sounds heard. Central nervous system: No focal neurological deficits. Extremities: no cyanosis, no clubbing. Skin: No petechiae. Psychiatry: Judgement and insight appear normal. Mood & affect appropriate.     Data Reviewed: I have personally reviewed following labs and imaging studies  CBC: Recent Labs  Lab 02/07/21 1620 02/08/21 0017  WBC 13.5* 10.5  NEUTROABS 12.5* 9.8*  HGB 11.2* 10.5*  HCT 34.8* 32.3*  MCV 95.1 92.8  PLT 158 139*    Basic Metabolic Panel: Recent Labs  Lab 02/07/21 1620 02/08/21 0017  NA 133* 134*  K 3.6 3.4*  CL 102 107  CO2 22 18*  GLUCOSE 162* 138*  BUN 23 23  CREATININE 1.81* 1.54*  CALCIUM 8.6* 7.9*  MG  --  1.6*  1.5*  PHOS  --  1.9*    GFR: Estimated Creatinine  Clearance: 44.7 mL/min (A) (by C-G formula based on SCr of 1.54 mg/dL (H)).  Liver Function Tests: Recent Labs  Lab 02/07/21 1620 02/08/21 0017  AST 17 13*  ALT 14 14  ALKPHOS 90 51  BILITOT 0.7 0.9  PROT 5.5* 5.3*  ALBUMIN 3.0* 2.8*    CBG: No results for input(s): GLUCAP in the last 168 hours.   Recent Results (from the past 240 hour(s))  Blood Culture (routine x 2)     Status: None (Preliminary result)   Collection Time: 02/07/21  4:20 PM   Specimen: BLOOD LEFT WRIST  Result Value Ref Range Status   Specimen Description BLOOD LEFT WRIST  Final   Special Requests   Final    BOTTLES DRAWN AEROBIC AND ANAEROBIC Blood Culture adequate  volume   Culture   Final    NO GROWTH < 24 HOURS Performed at [facility] Lab, [address] [place], [place] [address]    Report Status PENDING  Incomplete  Blood Culture (routine x 2)     Status: None (Preliminary result)   Collection Time: 02/07/21  4:27 PM   Specimen: BLOOD LEFT FOREARM  Result Value Ref Range Status   Specimen Description BLOOD LEFT FOREARM  Final   Special Requests   Final    BOTTLES DRAWN AEROBIC AND ANAEROBIC Blood Culture adequate volume   Culture   Final    NO GROWTH < 24 HOURS Performed at [facility], [address], [place], [place] [address]    Report Status PENDING  Incomplete  Resp Panel by RT-PCR (Flu A&B, Covid) Nasopharyngeal Swab     Status: None   Collection Time: 02/07/21  4:42 PM   Specimen: Nasopharyngeal Swab; Nasopharyngeal(NP) swabs in vial transport medium  Result Value Ref Range Status   SARS Coronavirus 2 by RT PCR NEGATIVE NEGATIVE Final    Comment: (NOTE) SARS-CoV-2 target nucleic acids are NOT DETECTED.  The SARS-CoV-2 RNA is generally detectable in upper respiratory specimens during the acute phase of infection. The lowest concentration of SARS-CoV-2 viral copies this assay can detect is 138 copies/mL. A negative result does not preclude SARS-Cov-2 infection and should not be used as the sole basis for treatment or other patient management decisions. A negative result may occur with  improper specimen collection/handling, submission of specimen other than nasopharyngeal swab, presence of viral mutation(s) within the areas targeted by this assay, and inadequate number of viral copies(<138 copies/mL). A negative result must be combined with clinical observations, patient history, and epidemiological information. The expected result is Negative.  Fact Sheet for Patients:  BloggerCourse.com  Fact Sheet for Healthcare Providers:  SeriousBroker.it  This test is no t  yet approved or cleared by the [place] FDA and  has been authorized for detection and/or diagnosis of SARS-CoV-2 by FDA under an Emergency Use Authorization (EUA). This EUA will remain  in effect (meaning this test can be used) for the duration of the COVID-19 declaration under Section 564(b)(1) of the Act, 21 U.S.C.section 360bbb-3(b)(1), unless the authorization is terminated  or revoked sooner.       Influenza A by PCR NEGATIVE NEGATIVE Final   Influenza B by PCR NEGATIVE NEGATIVE Final    Comment: (NOTE) The Xpert Xpress SARS-CoV-2/FLU/RSV plus assay is intended as an aid in the diagnosis of influenza from Nasopharyngeal swab specimens and should not be used as a sole basis for treatment. Nasal washings and aspirates are unacceptable for Xpert Xpress SARS-CoV-2/FLU/RSV testing.  Fact  Sheet for Patients: BloggerCourse.com  Fact Sheet for Healthcare Providers: SeriousBroker.it  This test is not yet approved or cleared by the [place] FDA and has been authorized for detection and/or diagnosis of SARS-CoV-2 by FDA under an Emergency Use Authorization (EUA). This EUA will remain in effect (meaning this test can be used) for the duration of the COVID-19 declaration under Section 564(b)(1) of the Act, 21 U.S.C. section 360bbb-3(b)(1), unless the authorization is terminated or revoked.  Performed at [facility], [address] [place], [place] [address]   MRSA PCR Screening     Status: None   Collection Time: 02/07/21  9:40 PM   Specimen: Nasal Mucosa; Nasopharyngeal  Result Value Ref Range Status   MRSA by PCR NEGATIVE NEGATIVE Final    Comment:        The GeneXpert MRSA Assay (FDA approved for NASAL specimens only), is one component of a comprehensive MRSA colonization surveillance program. It is not intended to diagnose MRSA infection nor to guide or monitor treatment for MRSA infections. Performed  at [facility] Lab, [address], [place], [place] [address]          Radiology Studies: DG Chest Port 1 View  Result Date: 02/07/2021 CLINICAL DATA:  Cough and fevers EXAM: PORTABLE CHEST 1 VIEW COMPARISON:  12/15/2014 FINDINGS: Cardiac shadow is stable. Loop recorder is noted. Aortic calcifications are seen. The lungs are well aerated bilaterally with some suggestion of early infiltrate in the right lung base. No bony abnormality is noted. IMPRESSION: Increased opacity in the right lung base suggestive of early infiltrate. Electronically Signed   By: Alcide Clever M.D.   On: 02/07/2021 16:14        Scheduled Meds: . budesonide (PULMICORT) nebulizer solution  0.5 mg Nebulization BID  . finasteride  5 mg Oral Daily  . gabapentin  300 mg Oral BID  . lamoTRIgine  400 mg Oral BID  . lisinopril  2.5 mg Oral Daily  . metoprolol tartrate  37.5 mg Oral BID  . mycophenolate  500 mg Oral BID  . saccharomyces boulardii  250 mg Oral BID  . simvastatin  40 mg Oral Daily   Continuous Infusions: . azithromycin 500 mg (02/07/21 2354)  . ceFEPime (MAXIPIME) IV 2 g (02/08/21 0909)     LOS: 1 day    Time spent: 35 minutes   Vassie Loll, MD [facility]   To contact the attending provider between 7A-7P or the covering provider during after hours 7P-7A, please log into the web site [email or web address] and access using universal [place] password for that web site. If you do not have the password, please call the hospital operator.  02/08/2021, 12:42 PM

## 2021-02-08 NOTE — Progress Notes (Signed)
   02/07/21 2309  Assess: MEWS Score  Temp (!) 101 F (38.3 C)  BP 135/73  Pulse Rate 100  Level of Consciousness Alert  SpO2 95 %  O2 Device Nasal Cannula  O2 Flow Rate (L/min) 4 L/min  Assess: MEWS Score  MEWS Temp 1  MEWS Systolic 0  MEWS Pulse 0  MEWS RR 1  MEWS LOC 0  MEWS Score 2  MEWS Score Color Yellow  Assess: if the MEWS score is Yellow or Red  Were vital signs taken at a resting state? Yes  Focused Assessment No change from prior assessment  Early Detection of Sepsis Score *See Row Information* Low  MEWS guidelines implemented *See Row Information* Yes  Treat  Pain Scale 0-10  Pain Score 0  Take Vital Signs  Increase Vital Sign Frequency  Yellow: Q 2hr X 2 then Q 4hr X 2, if remains yellow, continue Q 4hrs  Escalate  MEWS: Escalate Yellow: discuss with charge nurse/RN and consider discussing with provider and RRT  Notify: Charge Nurse/RN  Name of Charge Nurse/RN Notified Josephine, RN  Date Charge Nurse/RN Notified 02/07/21  Time Charge Nurse/RN Notified 2309  Document  Progress note created (see row info) Yes

## 2021-02-09 ENCOUNTER — Inpatient Hospital Stay (HOSPITAL_COMMUNITY): Payer: Medicare Other

## 2021-02-09 LAB — BASIC METABOLIC PANEL
Anion gap: 5 (ref 5–15)
BUN: 19 mg/dL (ref 8–23)
CO2: 23 mmol/L (ref 22–32)
Calcium: 8 mg/dL — ABNORMAL LOW (ref 8.9–10.3)
Chloride: 107 mmol/L (ref 98–111)
Creatinine, Ser: 1.41 mg/dL — ABNORMAL HIGH (ref 0.61–1.24)
GFR, Estimated: 54 mL/min — ABNORMAL LOW (ref >60)
Glucose, Bld: 120 mg/dL — ABNORMAL HIGH (ref 70–99)
Potassium: 3.4 mmol/L — ABNORMAL LOW (ref 3.5–5.1)
Sodium: 135 mmol/L (ref 135–145)

## 2021-02-09 LAB — RESPIRATORY PANEL BY PCR

## 2021-02-09 LAB — URINE CULTURE: Culture: NO GROWTH

## 2021-02-09 LAB — BLOOD GAS, ARTERIAL
Acid-base deficit: 6.2 mmol/L — ABNORMAL HIGH (ref 0.0–2.0)
Bicarbonate: 18.2 mmol/L — ABNORMAL LOW (ref 20.0–28.0)
Drawn by: 336832
FIO2: 100
O2 Saturation: 97.1 %
Patient temperature: 39
pCO2 arterial: 36.1 mmHg (ref 32.0–48.0)
pH, Arterial: 7.335 — ABNORMAL LOW (ref 7.350–7.450)
pO2, Arterial: 105 mmHg (ref 83.0–108.0)

## 2021-02-09 LAB — GASTROINTESTINAL PANEL BY PCR, STOOL (REPLACES STOOL CULTURE)

## 2021-02-09 LAB — CBC
HCT: 30.9 % — ABNORMAL LOW (ref 39.0–52.0)
Hemoglobin: 9.9 g/dL — ABNORMAL LOW (ref 13.0–17.0)
MCH: 30.4 pg (ref 26.0–34.0)
MCHC: 32 g/dL (ref 30.0–36.0)
MCV: 94.8 fL (ref 80.0–100.0)
Platelets: 162 10*3/uL (ref 150–400)
RBC: 3.26 MIL/uL — ABNORMAL LOW (ref 4.22–5.81)
RDW: 13 % (ref 11.5–15.5)
WBC: 8.5 10*3/uL (ref 4.0–10.5)
nRBC: 0 % (ref 0.0–0.2)

## 2021-02-09 LAB — CALCIUM, IONIZED: Calcium, Ionized, Serum: 4.6 mg/dL (ref 4.5–5.6)

## 2021-02-09 LAB — MAGNESIUM: Magnesium: 2.2 mg/dL (ref 1.7–2.4)

## 2021-02-09 LAB — C DIFFICILE QUICK SCREEN W PCR REFLEX
C Diff antigen: NEGATIVE
C Diff interpretation: NOT DETECTED
C Diff toxin: NEGATIVE

## 2021-02-09 LAB — PHOSPHORUS: Phosphorus: 2.9 mg/dL (ref 2.5–4.6)

## 2021-02-09 MED ORDER — ENOXAPARIN SODIUM 40 MG/0.4ML IJ SOSY
40.0000 mg | PREFILLED_SYRINGE | INTRAMUSCULAR | Status: DC
  Administered 2021-02-09 – 2021-02-14 (×6): 40 mg via SUBCUTANEOUS
  Filled 2021-02-09 (×6): qty 0.4

## 2021-02-09 MED ORDER — IPRATROPIUM-ALBUTEROL 0.5-2.5 (3) MG/3ML IN SOLN
3.0000 mL | Freq: Two times a day (BID) | RESPIRATORY_TRACT | Status: DC
  Administered 2021-02-09: 3 mL via RESPIRATORY_TRACT
  Filled 2021-02-09: qty 3

## 2021-02-09 MED ORDER — IPRATROPIUM-ALBUTEROL 0.5-2.5 (3) MG/3ML IN SOLN: 3.0000 mL | RESPIRATORY_TRACT | Status: DC | PRN

## 2021-02-09 MED ORDER — FUROSEMIDE 10 MG/ML IJ SOLN
20.0000 mg | Freq: Once | INTRAMUSCULAR | Status: AC
  Administered 2021-02-09: 20 mg via INTRAVENOUS
  Filled 2021-02-09: qty 2

## 2021-02-09 MED ORDER — POTASSIUM CHLORIDE CRYS ER 20 MEQ PO TBCR
40.0000 meq | EXTENDED_RELEASE_TABLET | Freq: Once | ORAL | Status: AC
  Administered 2021-02-09: 40 meq via ORAL
  Filled 2021-02-09: qty 2

## 2021-02-09 MED ORDER — IPRATROPIUM-ALBUTEROL 0.5-2.5 (3) MG/3ML IN SOLN
RESPIRATORY_TRACT | Status: AC
  Filled 2021-02-09: qty 3

## 2021-02-09 MED ORDER — IPRATROPIUM-ALBUTEROL 0.5-2.5 (3) MG/3ML IN SOLN
3.0000 mL | Freq: Four times a day (QID) | RESPIRATORY_TRACT | Status: DC
  Administered 2021-02-09 – 2021-02-11 (×6): 3 mL via RESPIRATORY_TRACT
  Filled 2021-02-09 (×6): qty 3

## 2021-02-09 MED ORDER — METHYLPREDNISOLONE SODIUM SUCC 40 MG IJ SOLR
40.0000 mg | Freq: Two times a day (BID) | INTRAMUSCULAR | Status: DC
  Administered 2021-02-09 – 2021-02-12 (×6): 40 mg via INTRAVENOUS
  Filled 2021-02-09 (×6): qty 1

## 2021-02-09 NOTE — Significant Event (Signed)
Rapid Response Event Note   Reason for Call :  Respiratory distress O2 sats 70s,  Placed on NRB  Initial Focused Assessment:  Patient sitting upright in the bed.  He is using his accessory muscles to breath.  Lung sounds are tight, slight expiratory wheeze and occasional rhonchi.  After breathing treatment: lungs sounds" Rhonchi through out more on right than left. He was able to cough up a large amount of sputum, lung sounds cleared with scattered expiratory wheezes.   BP 132/84  ST 102  RR  22  O2 sat 98% on NRB  Temp 102.3   Interventions:  Duoneb Tylenol PR ABG done PCXR done  Dr Tyson Babinski at bedside to assess patient 20 mg Lasix  Plan of Care:  Transfer to [facility]   Event Summary:   MD Notified: Pokhrel Call Time: 1657 Arrival Time: 1700 End Time:  1845   Marcellina Millin, RN

## 2021-02-09 NOTE — Progress Notes (Signed)
Made Dr. Tyson Babinski aware of patient feeling an aura, blurry and double vision. PRN IV Ativan medication given, vital signs WNL. No new orders at this time. Will continue to monitor

## 2021-02-09 NOTE — Progress Notes (Signed)
   02/09/21 1715  Assess: MEWS Score  Temp (!) 102.3 F (39.1 C)  BP 132/84  Pulse Rate (!) 102  SpO2 99 %  O2 Device Non-rebreather Mask  Assess: MEWS Score  MEWS Temp 2  MEWS Systolic 0  MEWS Pulse 1  MEWS RR 0  MEWS LOC 0  MEWS Score 3  MEWS Score Color Yellow  Assess: if the MEWS score is Yellow or Red  Were vital signs taken at a resting state? Yes  Focused Assessment Change from prior assessment (see assessment flowsheet)  Early Detection of Sepsis Score *See Row Information* High  MEWS guidelines implemented *See Row Information* No, vital signs rechecked  Treat  MEWS Interventions Consulted Respiratory Therapy;Administered scheduled meds/treatments  Pain Scale 0-10  Pain Score 0  Escalate  MEWS: Escalate Yellow: discuss with charge nurse/RN and consider discussing with provider and RRT  Notify: Charge Nurse/RN  Name of Charge Nurse/RN Notified Tina RN  Date Charge Nurse/RN Notified 02/09/21  Time Charge Nurse/RN Notified 1700  Notify: Provider  Provider Name/Title Pokhrel  Date Provider Notified 02/09/21  Time Provider Notified 1700  Notification Type Page  Notification Reason Change in status  Provider response See new orders;En route  Date of Provider Response 02/09/21  Time of Provider Response 1710  Document  Patient Outcome Transferred/level of care increased  Progress note created (see row info) Yes  Notified Dr.Pokhrel of patient in respiratory distress, requiring Non-rebreather. Respiratory and rapid RN notified of patient decline. ABG obtained and cxr. Per MD will transfer patient to progressive care.

## 2021-02-09 NOTE — Progress Notes (Addendum)
PROGRESS NOTE    Ryan Gentry  ZOX:[id number] DOB: December 16, 1950 DOA: 02/07/2021  PCP: Pcp, No   Brief Narrative:   Ryan Gentry is a 70 y.o. male with medical history significant for ANCA associated vasculitis, seizure disorder, generalized anxiety disorder, urinary incontinence, hyperlipidemia, presented to the  with hospital with shortness of breath, subjective fevers and chills with generalized myalgia and cough.  He also had few episodes of nausea and vomiting.  Of note, patient was traveling from [place] in route to [place].  He is on immunosuppressants with CellCept.  Patient presented to the ED and was noted to have oxygen saturation in the 80s on room air.  Patient was  given nasal cannula oxygen and was admitted to hospital.  Patient had creatinine of 1.6-1.7 in 2016 and around 1.6 in June 2021.Patient was then admitted to hospital for severe sepsis secondary to pneumonia with acute hypoxic respiratory failure.   Assessment & Plan:  Severe sepsis secondary to pneumonia Present on admission.  Negative MRSA PCR, so vancomycin was discontinued.  We will continue on cefepime and Zithromax.  Continue Pulmicort, duo nebs, oxygen supplementation, flutter valve and incentive spirometer .  Patient feels a little better  today but still very symptomatic.  Blood cultures negative in 2 days.  Urine culture no growth so far.  Procalcitonin elevated at 5.1.  Acute respiratory failure with hypoxia  Likely secondary to pneumonia.  Still on 4 L of oxygen.  We will continue to wean oxygen as able.  Continue antibiotics bronchodilators.    Seizures  No seizures on admission.  Continue Lamictal and Neurontin.  Seizure precautions.   Nausea & vomiting, diarrhea Continue as needed antiemetics.  Has improved.  GI pathogen panel was negative.  HIV was nonreactive.  Improved with Lomotil.  We will get C. difficile studies as well.  Hyponatremia Has improved.  Continue to hold HCTZ.  TSH at 2.5.  Mild  hypokalemia.  We will replenish.  Check levels in a.m.  We will give 40 mEq of potassium chloride today.  Elevated troponin Without any chest pain or EKG changes.  Likely nonspecific elevation.  GAD (generalized anxiety disorder) -continue ativan PRN  Hypophosphatemia, hypomagnesemia on presentation. Replenished with IV supplementation.  Will check levels in a.m.    BPH (benign prostatic hyperplasia) -continue proscar and flomax  ANCA associated vasculitis  Continue CellCept and follow-up with rheumatology as outpatient.  He is from [place].    Essential HTN -continue lisinopril and metoprolol.  Continue to monitor blood pressure.   DVT prophylaxis:  On SCD.  Add Lovenox subcu  Code Status:  Full code  Family Communication: I spoke with the patient's wife and updated her about the clinical condition of the patient. Answered all the questions.   Disposition:  Status is: Inpatient  Dispo: The patient is from: home               Anticipated d/c is to: Likely home with home health              Patient currently not medically stable for discharge;     Difficult to place patient: no    Consultants:   None  Procedures:  None.  Antimicrobials:  Vancomycin 02/07/21>>02/08/21 Cefepime 02/07/21 Zithromax 02/07/21  Subjective: Patient was seen and examined at bedside.  Patient complains of cough with shortness of breath and productive sputum.  Denies any chest pain.  Denies any nausea, vomiting.  Has not had a bowel movement since yesterday.  Objective: Vitals:   02/09/21 0504 02/09/21 0753 02/09/21 0754 02/09/21 0853  BP: 108/60   130/72  Pulse: 91   99  Resp: 20   (!) 21  Temp: 98 F (36.7 C)     TempSrc: Oral     SpO2: 92% 90% 90% 93%  Weight: 71.5 kg     Height:        Intake/Output Summary (Last 24 hours) at 02/09/2021 1111 Last data filed at 02/09/2021 1019 Gross per 24 hour  Intake 1710.42 ml  Output 725 ml  Net 985.42 ml   Filed Weights   2021/02/28 2309  02/08/21 0500 02/09/21 0504  Weight: 70.3 kg 70.8 kg 71.5 kg    Physical examination:  General:  Average built, not in obvious distress.  Tremulous, intermittent cough on 4 L of oxygen by nasal cannula HENT:   No scleral pallor or icterus noted. Oral mucosa is moist.  Chest: Diminished breath sounds bilaterally, coarse breath sounds noted. CVS: S1 &S2 heard. No murmur.  Regular rate and rhythm. Abdomen: Soft, nontender, nondistended.  Bowel sounds are heard.   Extremities: No cyanosis, clubbing or edema.  Peripheral pulses are palpable. Psych: Alert, awake and oriented, mild tremors noted CNS:  No cranial nerve deficits.  Power equal in all extremities.   Skin: Warm and dry.  No rashes noted.   Data Reviewed: I have personally reviewed the following labs and imaging studies.   CBC: Recent Labs  Lab Feb 28, 2021 1620 02/08/21 0017 02/09/21 0247  WBC 13.5* 10.5 8.5  NEUTROABS 12.5* 9.8*  --   HGB 11.2* 10.5* 9.9*  HCT 34.8* 32.3* 30.9*  MCV 95.1 92.8 94.8  PLT 158 139* 162    Basic Metabolic Panel: Recent Labs  Lab 02-28-21 1620 02/08/21 0017 02/09/21 0247  NA 133* 134* 135  K 3.6 3.4* 3.4*  CL 102 107 107  CO2 22 18* 23  GLUCOSE 162* 138* 120*  BUN 23 23 19   CREATININE 1.81* 1.54* 1.41*  CALCIUM 8.6* 7.9* 8.0*  MG  --  1.6*  1.5* 2.2  PHOS  --  1.9* 2.9    GFR: Estimated Creatinine Clearance: 49.3 mL/min (A) (by C-G formula based on SCr of 1.41 mg/dL (H)).  Liver Function Tests: Recent Labs  Lab 02-28-2021 1620 02/08/21 0017  AST 17 13*  ALT 14 14  ALKPHOS 90 51  BILITOT 0.7 0.9  PROT 5.5* 5.3*  ALBUMIN 3.0* 2.8*    CBG: No results for input(s): GLUCAP in the last 168 hours.   Recent Results (from the past 240 hour(s))  Blood Culture (routine x 2)     Status: None (Preliminary result)   Collection Time: 28-Feb-2021  4:20 PM   Specimen: BLOOD LEFT WRIST  Result Value Ref Range Status   Specimen Description BLOOD LEFT WRIST  Final   Special Requests    Final    BOTTLES DRAWN AEROBIC AND ANAEROBIC Blood Culture adequate volume   Culture   Final    NO GROWTH 2 DAYS Performed at [facility], [address] [place], [place]    Report Status PENDING  Incomplete  Blood Culture (routine x 2)     Status: None (Preliminary result)   Collection Time: 2021-02-28  4:27 PM   Specimen: BLOOD LEFT FOREARM  Result Value Ref Range Status   Specimen Description BLOOD LEFT FOREARM  Final   Special Requests   Final    BOTTLES DRAWN AEROBIC AND ANAEROBIC Blood Culture adequate volume  Culture   Final    NO GROWTH 2 DAYS Performed at [facility], [address] [place], [place] [address]    Report Status PENDING  Incomplete  Resp Panel by RT-PCR (Flu A&B, Covid) Nasopharyngeal Swab     Status: None   Collection Time: 02/07/21  4:42 PM   Specimen: Nasopharyngeal Swab; Nasopharyngeal(NP) swabs in vial transport medium  Result Value Ref Range Status   SARS Coronavirus 2 by RT PCR NEGATIVE NEGATIVE Final    Comment: (NOTE) SARS-CoV-2 target nucleic acids are NOT DETECTED.  The SARS-CoV-2 RNA is generally detectable in upper respiratory specimens during the acute phase of infection. The lowest concentration of SARS-CoV-2 viral copies this assay can detect is 138 copies/mL. A negative result does not preclude SARS-Cov-2 infection and should not be used as the sole basis for treatment or other patient management decisions. A negative result may occur with  improper specimen collection/handling, submission of specimen other than nasopharyngeal swab, presence of viral mutation(s) within the areas targeted by this assay, and inadequate number of viral copies(<138 copies/mL). A negative result must be combined with clinical observations, patient history, and epidemiological information. The expected result is Negative.  Fact Sheet for Patients:  BloggerCourse.com  Fact Sheet for Healthcare Providers:   SeriousBroker.it  This test is no t yet approved or cleared by the [place] FDA and  has been authorized for detection and/or diagnosis of SARS-CoV-2 by FDA under an Emergency Use Authorization (EUA). This EUA will remain  in effect (meaning this test can be used) for the duration of the COVID-19 declaration under Section 564(b)(1) of the Act, 21 U.S.C.section 360bbb-3(b)(1), unless the authorization is terminated  or revoked sooner.       Influenza A by PCR NEGATIVE NEGATIVE Final   Influenza B by PCR NEGATIVE NEGATIVE Final    Comment: (NOTE) The Xpert Xpress SARS-CoV-2/FLU/RSV plus assay is intended as an aid in the diagnosis of influenza from Nasopharyngeal swab specimens and should not be used as a sole basis for treatment. Nasal washings and aspirates are unacceptable for Xpert Xpress SARS-CoV-2/FLU/RSV testing.  Fact Sheet for Patients: BloggerCourse.com  Fact Sheet for Healthcare Providers: SeriousBroker.it  This test is not yet approved or cleared by the [place] FDA and has been authorized for detection and/or diagnosis of SARS-CoV-2 by FDA under an Emergency Use Authorization (EUA). This EUA will remain in effect (meaning this test can be used) for the duration of the COVID-19 declaration under Section 564(b)(1) of the Act, 21 U.S.C. section 360bbb-3(b)(1), unless the authorization is terminated or revoked.  Performed at [facility], [address] [place], [place] [address]   Urine culture     Status: None   Collection Time: 02/07/21  6:20 PM   Specimen: Urine, Random  Result Value Ref Range Status   Specimen Description URINE, RANDOM  Final   Special Requests NONE  Final   Culture   Final    NO GROWTH Performed at [facility], [address], [place], [place] [address]    Report Status 02/09/2021 FINAL  Final  MRSA PCR Screening     Status: None    Collection Time: 02/07/21  9:40 PM   Specimen: Nasal Mucosa; Nasopharyngeal  Result Value Ref Range Status   MRSA by PCR NEGATIVE NEGATIVE Final    Comment:        The GeneXpert MRSA Assay (FDA approved for NASAL specimens only), is one component of a comprehensive  MRSA colonization surveillance program. It is not intended to diagnose MRSA infection nor to guide or monitor treatment for MRSA infections. Performed at [facility], [address], [place], [place] [address]   Gastrointestinal Panel by PCR , Stool     Status: None   Collection Time: 02/07/21 11:29 PM   Specimen: Nasal Mucosa; Stool  Result Value Ref Range Status   Campylobacter species NOT DETECTED NOT DETECTED Final   Plesimonas shigelloides NOT DETECTED NOT DETECTED Final   Salmonella species NOT DETECTED NOT DETECTED Final   Yersinia enterocolitica NOT DETECTED NOT DETECTED Final   Vibrio species NOT DETECTED NOT DETECTED Final   Vibrio cholerae NOT DETECTED NOT DETECTED Final   Enteroaggregative E coli (EAEC) NOT DETECTED NOT DETECTED Final   Enteropathogenic E coli (EPEC) NOT DETECTED NOT DETECTED Final   Enterotoxigenic E coli (ETEC) NOT DETECTED NOT DETECTED Final   Shiga like toxin producing E coli (STEC) NOT DETECTED NOT DETECTED Final   Shigella/Enteroinvasive E coli (EIEC) NOT DETECTED NOT DETECTED Final   Cryptosporidium NOT DETECTED NOT DETECTED Final   Cyclospora cayetanensis NOT DETECTED NOT DETECTED Final   Entamoeba histolytica NOT DETECTED NOT DETECTED Final   Giardia lamblia NOT DETECTED NOT DETECTED Final   Adenovirus F40/41 NOT DETECTED NOT DETECTED Final   Astrovirus NOT DETECTED NOT DETECTED Final   Norovirus GI/GII NOT DETECTED NOT DETECTED Final   Rotavirus A NOT DETECTED NOT DETECTED Final   Sapovirus (I, II, IV, and V) NOT DETECTED NOT DETECTED Final    Comment: Performed at [facility], [address], [place], [place] [address]     Radiology Studies: DG Chest Port  1 View  Result Date: 02/07/2021 CLINICAL DATA:  Cough and fevers EXAM: PORTABLE CHEST 1 VIEW COMPARISON:  12/15/2014 FINDINGS: Cardiac shadow is stable. Loop recorder is noted. Aortic calcifications are seen. The lungs are well aerated bilaterally with some suggestion of early infiltrate in the right lung base. No bony abnormality is noted. IMPRESSION: Increased opacity in the right lung base suggestive of early infiltrate. Electronically Signed   By: Alcide Clever M.D.   On: 02/07/2021 16:14     Scheduled Meds: . budesonide (PULMICORT) nebulizer solution  0.5 mg Nebulization BID  . finasteride  5 mg Oral Daily  . gabapentin  300 mg Oral BID  . ipratropium-albuterol  3 mL Nebulization BID  . lamoTRIgine  400 mg Oral BID  . lisinopril  2.5 mg Oral Daily  . metoprolol tartrate  37.5 mg Oral BID  . mycophenolate  500 mg Oral BID  . saccharomyces boulardii  250 mg Oral BID  . simvastatin  40 mg Oral Daily   Continuous Infusions: . azithromycin 500 mg (02/08/21 2108)  . ceFEPime (MAXIPIME) IV 2 g (02/09/21 0901)  . lactated ringers 75 mL/hr at 02/09/21 0202     LOS: 2 days    Joycelyn Das, MD [facility] 02/09/2021, 11:11 AM

## 2021-02-09 NOTE — Progress Notes (Signed)
Called to PTs room for sob in need of a nebulizer treatment. PT presented in moderate distress, saying I cant breathe, and taking shallow breathes. I gave two back to back duo neb treatments which immediately improved his breathing. Plan will be to schedule duo neb twice a day and encourage deep breathing incentive  spirometry use, to prevent this from happening again.

## 2021-02-10 LAB — BASIC METABOLIC PANEL
Anion gap: 9 (ref 5–15)
BUN: 19 mg/dL (ref 8–23)
CO2: 22 mmol/L (ref 22–32)
Calcium: 8.2 mg/dL — ABNORMAL LOW (ref 8.9–10.3)
Chloride: 106 mmol/L (ref 98–111)
Creatinine, Ser: 1.38 mg/dL — ABNORMAL HIGH (ref 0.61–1.24)
GFR, Estimated: 55 mL/min — ABNORMAL LOW (ref >60)
Glucose, Bld: 174 mg/dL — ABNORMAL HIGH (ref 70–99)
Potassium: 4.1 mmol/L (ref 3.5–5.1)
Sodium: 137 mmol/L (ref 135–145)

## 2021-02-10 LAB — PHOSPHORUS: Phosphorus: 2.3 mg/dL — ABNORMAL LOW (ref 2.5–4.6)

## 2021-02-10 LAB — CBC
HCT: 30.7 % — ABNORMAL LOW (ref 39.0–52.0)
Hemoglobin: 9.8 g/dL — ABNORMAL LOW (ref 13.0–17.0)
MCH: 29.8 pg (ref 26.0–34.0)
MCHC: 31.9 g/dL (ref 30.0–36.0)
MCV: 93.3 fL (ref 80.0–100.0)
Platelets: 179 10*3/uL (ref 150–400)
RBC: 3.29 MIL/uL — ABNORMAL LOW (ref 4.22–5.81)
RDW: 13.2 % (ref 11.5–15.5)
WBC: 7.6 10*3/uL (ref 4.0–10.5)
nRBC: 0 % (ref 0.0–0.2)

## 2021-02-10 LAB — MYCOPLASMA PNEUMONIAE ANTIBODY, IGM: Mycoplasma pneumo IgM: <770 U/mL (ref 0–769)

## 2021-02-10 LAB — MAGNESIUM: Magnesium: 2 mg/dL (ref 1.7–2.4)

## 2021-02-10 LAB — LAMOTRIGINE LEVEL: Lamotrigine Lvl: 13.5 ug/mL (ref 2.0–20.0)

## 2021-02-10 LAB — PROCALCITONIN: Procalcitonin: 4.14 ng/mL

## 2021-02-10 MED ORDER — K PHOS MONO-SOD PHOS DI & MONO 155-852-130 MG PO TABS
500.0000 mg | ORAL_TABLET | Freq: Three times a day (TID) | ORAL | Status: AC
  Administered 2021-02-10 (×3): 500 mg via ORAL
  Filled 2021-02-10 (×3): qty 2

## 2021-02-10 NOTE — Progress Notes (Signed)
Pharmacy Antibiotic Note  Ryan Gentry is a 70 y.o. male admitted on 02/07/2021 with sepsis.  Pharmacy consulted on 5/2 for vancomycin and cefepime dosing. Vancomycin discontinued as MRSA PCR was negative.   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   Severe sepsis 2/2 RLL PNA:  Continues on  Cefepime 2 g IV q12h and Azithromycin 500mg  IV q24h. Currently afebrile, Tmax 102.3 on 5/4 ~ 1700  WBC remains WNL, PCT 5.1, LA 1. Blood cultures negative x 3 days. Pending. Urine culture negative.   SCR improved to1.38, estimated CrCl= 51 ml/min, current Cefepime dose remains appropriate at 2g q12h  (for CrCl <58ml/min)   Plan:  Continue Cefepime 2 G Q12H  Monitor clinical status, renal function and culture results.   Height: 5\' 10"  (177.8 cm) Weight: 81.6 kg (179 lb 14.3 oz) IBW/kg (Calculated) : 73  Temp (24hrs), Avg:99.1 F (37.3 C), Min:97.8 F (36.6 C), Max:102.3 F (39.1 C)  Recent Labs  Lab 02/07/21 1620 02/07/21 1716 02/07/21 1820 02/08/21 0017 02/09/21 0247 02/10/21 0406  WBC 13.5*  --   --  10.5 8.5 7.6  CREATININE 1.81*  --   --  1.54* 1.41* 1.38*  LATICACIDVEN  --  1.4 1.2  --   --   --     Estimated Creatinine Clearance: 51.4 mL/min (A) (by C-G formula based on SCr of 1.38 mg/dL (H)).    Allergies  Allergen Reactions  . Citrullus Vulgaris Nausea And Vomiting    Other reaction(s): Upper Airway Edema  . Cucumber Extract Nausea And Vomiting    Other reaction(s): Upper Airway Edema    Antimicrobials this admission: Flagyl x1 5/2 Vanc x1 5/2 >> 5/2 Cefepime 5/2 >> Azith 5/2 >>     Dose adjustments this admission: n/a  Microbiology results: 5/2 MRSA PCR - negative 5/2 BCx - NGTD x 3d 5/2 UCx - neg 5/2 GI panel PCR - neg  5/4 REsp panel PCR: neg 5/4 C diff PCR: neg   Thank you for allowing pharmacy to be a part of this patient's  care.   7/2, RPh Clinical Pharmacist [phone] Please check AMION for all [facility] Pharm[facility] phone numbers After 10:00 PM, call Main Pharmacy [phone]  02/10/2021 1:29 PM

## 2021-02-10 NOTE — Progress Notes (Signed)
PROGRESS NOTE    Malon KindleWilliam Doria  WUJ:[id number] DOB: [birth year] DOA: 02/07/2021  PCP: Pcp, No   Brief Narrative:   Malon KindleWilliam Wool is a 70 y.o. male with medical history significant for ANCA associated vasculitis, seizure disorder, generalized anxiety disorder, urinary incontinence, hyperlipidemia, presented to the  with hospital with shortness of breath, subjective fevers and chills with generalized myalgia and cough.  He also had few episodes of nausea and vomiting.  Of note, patient [place] from [place] route to [place].  He is on immunosuppressants with CellCept.  Patient presented to the ED and was noted to have oxygen saturation in the 80s on room air.  Patient was  given nasal cannula oxygen and was admitted to hospital.  Patient had creatinine of 1.6-1.7 in 2016 and around 1.6 in June 2021. Patient was then admitted to hospital for severe sepsis secondary to pneumonia with acute hypoxic respiratory failure.   Assessment & Plan:  Severe sepsis secondary to pneumonia Present on admission.  Negative MRSA PCR, so vancomycin was discontinued.  We will continue on cefepime and Zithromax due to underlying history of immune suppression..  Continue Pulmicort, duo nebs, oxygen supplementation, flutter valve and incentive spirometer .  Patient was very symptomatic yesterday and was put on nonrebreather mask and was transferred to progressive care unit.  Blood cultures negative in 3 days.  Urine culture no growth so far.  Procalcitonin elevated at 5.1.  Respiratory viral panel including influenza and COVID-19 was negative.  Procalcitonin is still significantly elevated.  Temperature max was 102.3 F.  Acute respiratory failure with hypoxia  Likely secondary to pneumonia.  Patient required nonrebreather mask followed by 2 L of high flow nasal cannula currently.  Still feels dyspneic and short of breath.  Continue with IV antibiotics, bronchodilators.  Received 1 dose of IV Lasix yesterday.  Repeat  chest x-ray done yesterday showed worsening of pneumonia.  Seizures  No seizures on admission.  Continue Lamictal and Neurontin.  Seizure precautions.  Follow Lamictal levels.  Nausea & vomiting, diarrhea Significantly improved.  Continue as needed antiemetics.    GI pathogen panel was negative.  HIV was nonreactive.  Improved with Lomotil.  C. difficile was negative as well.  Hyponatremia Resolved.  Continue to hold HCTZ.  TSH at 2.5.  Mild hypokalemia.  After replacement.  We will continue to monitor.  Elevated troponin Without any chest pain or EKG changes.  Likely nonspecific elevation.  GAD (generalized anxiety disorder) -continue ativan PRN  Hypophosphatemia, hypomagnesemia on presentation. Replenished with IV supplementation.  Mild hypophosphatemia today.  We will continue to replace with K-Phos neutral..  Magnesium levels have improved.  BPH (benign prostatic hyperplasia) -continue proscar and flomax  ANCA associated vasculitis  Continue CellCept and follow-up with rheumatology as outpatient.  He is from [place].    Essential HTN -continue lisinopril and metoprolol.  Continue to monitor blood pressure.   DVT prophylaxis:  On SCD.  Add Lovenox subcu  Code Status:  Full code discussion with the family.  He however expresses that if it were to come to a point where he would need CPR or be living in a prolonged life support he would not want that.  Family Communication: I again had a prolonged discussion with the patient and the patient's wife at bedside.    Disposition:  Status is: Inpatient due to IV antibiotics, acute hypoxic respiratory failure,  Dispo: The patient is from: home  Anticipated d/c is to: Likely home with home health likely in 2 to 3 days.              Patient currently not medically stable for discharge;     Difficult to place patient: no    Consultants:   None  Procedures:  None.  Antimicrobials:  Vancomycin  02-12-2021>>02/08/21 Cefepime 02-12-2021> Zithromax 02/12/2021>  Subjective: Today, patient was seen and examined at bedside.  Patient still complains of dyspnea cough shortness of breath.  On high flow nasal cannula.  Feels frustrated about his health.  Patient's wife at bedside  Objective: Vitals:   02/10/21 0729 02/10/21 0730 02/10/21 0820 02/10/21 0905  BP:   (!) 141/90 (!) 152/94  Pulse:   (!) 113 (!) 111  Resp:   14   Temp:   98.5 F (36.9 C)   TempSrc:   Oral   SpO2:  99% 95%   Weight: 81.6 kg     Height:        Intake/Output Summary (Last 24 hours) at 02/10/2021 1134 Last data filed at 02/10/2021 0200 Gross per 24 hour  Intake 240 ml  Output 925 ml  Net -685 ml   Filed Weights   02/08/21 0500 02/09/21 0504 02/10/21 0729  Weight: 70.8 kg 71.5 kg 81.6 kg    Physical examination: General:  Average built, not in obvious distress.  On high flow nasal cannula at 2 L/min.  Dyspneic and mildly tachypneic.   HENT:   No scleral pallor or icterus noted. Oral mucosa is moist.  Chest: Mild rhonchi noted, diminished breath sounds bilaterally.  No crackles noted. CVS: S1 &S2 heard. No murmur.  Regular rate and rhythm. Abdomen: Soft, nontender, nondistended.  Bowel sounds are heard.   Extremities: No cyanosis, clubbing or edema.  Peripheral pulses are palpable. Psych: Alert, awake and oriented, communicative. CNS:  No cranial nerve deficits.  Power equal in all extremities.   Skin: Warm and dry.  No rashes noted.  Data Reviewed: I have personally reviewed the following labs and imaging studies.   CBC: Recent Labs  Lab 02/12/21 1620 02/08/21 0017 02/09/21 0247 02/10/21 0406  WBC 13.5* 10.5 8.5 7.6  NEUTROABS 12.5* 9.8*  --   --   HGB 11.2* 10.5* 9.9* 9.8*  HCT 34.8* 32.3* 30.9* 30.7*  MCV 95.1 92.8 94.8 93.3  PLT 158 139* 162 179    Basic Metabolic Panel: Recent Labs  Lab Feb 12, 2021 1620 02/08/21 0017 02/09/21 0247 02/10/21 0406  NA 133* 134* 135 137  K 3.6 3.4* 3.4* 4.1  CL  102 107 107 106  CO2 22 18* 23 22  GLUCOSE 162* 138* 120* 174*  BUN 23 23 19 19   CREATININE 1.81* 1.54* 1.41* 1.38*  CALCIUM 8.6* 7.9* 8.0* 8.2*  MG  --  1.6*  1.5* 2.2 2.0  PHOS  --  1.9* 2.9 2.3*    GFR: Estimated Creatinine Clearance: 51.4 mL/min (A) (by C-G formula based on SCr of 1.38 mg/dL (H)).  Liver Function Tests: Recent Labs  Lab 2021/02/12 1620 02/08/21 0017  AST 17 13*  ALT 14 14  ALKPHOS 90 51  BILITOT 0.7 0.9  PROT 5.5* 5.3*  ALBUMIN 3.0* 2.8*    CBG: No results for input(s): GLUCAP in the last 168 hours.   Recent Results (from the past 240 hour(s))  Blood Culture (routine x 2)     Status: None (Preliminary result)   Collection Time: 02-12-21  4:20 PM   Specimen: BLOOD LEFT WRIST  Result Value Ref Range Status   Specimen Description BLOOD LEFT WRIST  Final   Special Requests   Final    BOTTLES DRAWN AEROBIC AND ANAEROBIC Blood Culture adequate volume   Culture   Final    NO GROWTH 3 DAYS Performed at [facility], [address], [place], [place] [address]    Report Status PENDING  Incomplete  Blood Culture (routine x 2)     Status: None (Preliminary result)   Collection Time: 02/07/21  4:27 PM   Specimen: BLOOD LEFT FOREARM  Result Value Ref Range Status   Specimen Description BLOOD LEFT FOREARM  Final   Special Requests   Final    BOTTLES DRAWN AEROBIC AND ANAEROBIC Blood Culture adequate volume   Culture   Final    NO GROWTH 3 DAYS Performed at [facility], [address], [place], [place] [address]    Report Status PENDING  Incomplete  Resp Panel by RT-PCR (Flu A&B, Covid) Nasopharyngeal Swab     Status: None   Collection Time: 02/07/21  4:42 PM   Specimen: Nasopharyngeal Swab; Nasopharyngeal(NP) swabs in vial transport medium  Result Value Ref Range Status   SARS Coronavirus 2 by RT PCR NEGATIVE NEGATIVE Final    Comment: (NOTE) SARS-CoV-2 target nucleic acids are NOT DETECTED.  The SARS-CoV-2 RNA is generally detectable  in upper respiratory specimens during the acute phase of infection. The lowest concentration of SARS-CoV-2 viral copies this assay can detect is 138 copies/mL. A negative result does not preclude SARS-Cov-2 infection and should not be used as the sole basis for treatment or other patient management decisions. A negative result may occur with  improper specimen collection/handling, submission of specimen other than nasopharyngeal swab, presence of viral mutation(s) within the areas targeted by this assay, and inadequate number of viral copies(<138 copies/mL). A negative result must be combined with clinical observations, patient history, and epidemiological information. The expected result is Negative.  Fact Sheet for Patients:  BloggerCourse.com  Fact Sheet for Healthcare Providers:  SeriousBroker.it  This test is no t yet approved or cleared by the [place] FDA and  has been authorized for detection and/or diagnosis of SARS-CoV-2 by FDA under an Emergency Use Authorization (EUA). This EUA will remain  in effect (meaning this test can be used) for the duration of the COVID-19 declaration under Section 564(b)(1) of the Act, 21 U.S.C.section 360bbb-3(b)(1), unless the authorization is terminated  or revoked sooner.       Influenza A by PCR NEGATIVE NEGATIVE Final   Influenza B by PCR NEGATIVE NEGATIVE Final    Comment: (NOTE) The Xpert Xpress SARS-CoV-2/FLU/RSV plus assay is intended as an aid in the diagnosis of influenza from Nasopharyngeal swab specimens and should not be used as a sole basis for treatment. Nasal washings and aspirates are unacceptable for Xpert Xpress SARS-CoV-2/FLU/RSV testing.  Fact Sheet for Patients: BloggerCourse.com  Fact Sheet for Healthcare Providers: SeriousBroker.it  This test is not yet approved or cleared by the [place] FDA and has  been authorized for detection and/or diagnosis of SARS-CoV-2 by FDA under an Emergency Use Authorization (EUA). This EUA will remain in effect (meaning this test can be used) for the duration of the COVID-19 declaration under Section 564(b)(1) of the Act, 21 U.S.C. section 360bbb-3(b)(1), unless the authorization is terminated or revoked.  Performed at [facility], [address], [place], [place] [address]   Urine culture     Status: None  Collection Time: 02/07/21  6:20 PM   Specimen: Urine, Random  Result Value Ref Range Status   Specimen Description URINE, RANDOM  Final   Special Requests NONE  Final   Culture   Final    NO GROWTH Performed at [facility], [address], [place], [place] [address]    Report Status 02/09/2021 FINAL  Final  MRSA PCR Screening     Status: None   Collection Time: 02/07/21  9:40 PM   Specimen: Nasal Mucosa; Nasopharyngeal  Result Value Ref Range Status   MRSA by PCR NEGATIVE NEGATIVE Final    Comment:        The GeneXpert MRSA Assay (FDA approved for NASAL specimens only), is one component of a comprehensive MRSA colonization surveillance program. It is not intended to diagnose MRSA infection nor to guide or monitor treatment for MRSA infections. Performed at [facility], [address], [place], [place] [address]   Gastrointestinal Panel by PCR , Stool     Status: None   Collection Time: 02/07/21 11:29 PM   Specimen: Nasal Mucosa; Stool  Result Value Ref Range Status   Campylobacter species NOT DETECTED NOT DETECTED Final   Plesimonas shigelloides NOT DETECTED NOT DETECTED Final   Salmonella species NOT DETECTED NOT DETECTED Final   Yersinia enterocolitica NOT DETECTED NOT DETECTED Final   Vibrio species NOT DETECTED NOT DETECTED Final   Vibrio cholerae NOT DETECTED NOT DETECTED Final   Enteroaggregative E coli (EAEC) NOT DETECTED NOT DETECTED Final   Enteropathogenic E coli (EPEC) NOT DETECTED NOT DETECTED  Final   Enterotoxigenic E coli (ETEC) NOT DETECTED NOT DETECTED Final   Shiga like toxin producing E coli (STEC) NOT DETECTED NOT DETECTED Final   Shigella/Enteroinvasive E coli (EIEC) NOT DETECTED NOT DETECTED Final   Cryptosporidium NOT DETECTED NOT DETECTED Final   Cyclospora cayetanensis NOT DETECTED NOT DETECTED Final   Entamoeba histolytica NOT DETECTED NOT DETECTED Final   Giardia lamblia NOT DETECTED NOT DETECTED Final   Adenovirus F40/41 NOT DETECTED NOT DETECTED Final   Astrovirus NOT DETECTED NOT DETECTED Final   Norovirus GI/GII NOT DETECTED NOT DETECTED Final   Rotavirus A NOT DETECTED NOT DETECTED Final   Sapovirus (I, II, IV, and V) NOT DETECTED NOT DETECTED Final    Comment: Performed at [facility], [address] [place], [place] [address]  C Difficile Quick Screen w PCR reflex     Status: None   Collection Time: 02/09/21 11:33 AM   Specimen: STOOL  Result Value Ref Range Status   C Diff antigen NEGATIVE NEGATIVE Final   C Diff toxin NEGATIVE NEGATIVE Final   C Diff interpretation No C. difficile detected.  Final    Comment: Performed at [facility], [address] [place], [place] [address]  Respiratory (~20 pathogens) panel by PCR     Status: None   Collection Time: 02/09/21  5:18 PM   Specimen: Nasopharyngeal Swab; Respiratory  Result Value Ref Range Status   Adenovirus NOT DETECTED NOT DETECTED Final   Coronavirus 229E NOT DETECTED NOT DETECTED Final    Comment: (NOTE) The Coronavirus on the Respiratory Panel, DOES NOT test for the novel  Coronavirus (2019 nCoV)    Coronavirus HKU1 NOT DETECTED NOT DETECTED Final   Coronavirus NL63 NOT DETECTED NOT DETECTED Final   Coronavirus OC43 NOT DETECTED NOT DETECTED Final   Metapneumovirus NOT DETECTED NOT DETECTED Final   Rhinovirus / Enterovirus NOT DETECTED NOT DETECTED  Final   Influenza A NOT DETECTED NOT DETECTED Final   Influenza B NOT DETECTED NOT DETECTED Final   Parainfluenza Virus 1  NOT DETECTED NOT DETECTED Final   Parainfluenza Virus 2 NOT DETECTED NOT DETECTED Final   Parainfluenza Virus 3 NOT DETECTED NOT DETECTED Final   Parainfluenza Virus 4 NOT DETECTED NOT DETECTED Final   Respiratory Syncytial Virus NOT DETECTED NOT DETECTED Final   Bordetella pertussis NOT DETECTED NOT DETECTED Final   Bordetella Parapertussis NOT DETECTED NOT DETECTED Final   Chlamydophila pneumoniae NOT DETECTED NOT DETECTED Final   Mycoplasma pneumoniae NOT DETECTED NOT DETECTED Final    Comment: Performed at [facility], [address] [place], [place] [address]     Radiology Studies: DG CHEST PORT 1 VIEW  Result Date: 02/09/2021 CLINICAL DATA:  Dyspnea. EXAM: PORTABLE CHEST 1 VIEW COMPARISON:  Single-view of the chest 02/07/2021. PA and lateral chest 12/15/2014. 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 There is right much worse than left basilar airspace disease most consistent with pneumonia. Heart size is normal. Aortic atherosclerosis. No pneumothorax or pleural fluid. Loop recorder noted. No acute or focal bony abnormality. IMPRESSION: Right worse than left basilar airspace disease most consistent with pneumonia. Aortic Atherosclerosis (ICD10-I70.0) and Emphysema (ICD10-J43.9). Electronically Signed   By: Drusilla Kanner M.D.   On: 02/09/2021 17:51     Scheduled Meds: . budesonide (PULMICORT) nebulizer solution  0.5 mg Nebulization BID  . enoxaparin (LOVENOX) injection  40 mg Subcutaneous Q24H  . finasteride  5 mg Oral Daily  . gabapentin  300 mg Oral BID  . ipratropium-albuterol  3 mL Nebulization Q6H  . lamoTRIgine  400 mg Oral BID  . lisinopril  2.5 mg Oral Daily  . methylPREDNISolone (SOLU-MEDROL) injection  40 mg Intravenous Q12H  . metoprolol tartrate  37.5 mg Oral BID  . mycophenolate  500 mg Oral BID  . phosphorus  500 mg Oral TID  . saccharomyces boulardii  250 mg Oral BID  . simvastatin  40 mg Oral Daily   Continuous Infusions: . azithromycin 500 mg (02/09/21  2229)  . ceFEPime (MAXIPIME) IV 2 g (02/10/21 0910)     LOS: 3 days    Joycelyn Das, MD [facility] 02/10/2021, 11:34 AM

## 2021-02-10 NOTE — Evaluation (Signed)
Physical Therapy Evaluation Patient Details Name: Ryan Gentry MRN: [id number] DOB: 23-Nov-1950 Today's Date: 02/10/2021   History of Present Illness  70 y.o. male presents to [facility] ED on 02/07/2021 with complaints of SOB. Pt found to be septic 2/2 RLL PNA. Pt also reports recent history of vomiting and diarrhea. PMH includes ANCA associated vasculitis, seizure, generalized anxitey disorder, urinary incontinence, HLD.  Clinical Impression  Pt reports desire to leave hospital today, stating he does not know if he will make it if he stays through tomorrow. Pt is on 11 L HFNC upon PT arrival and utilizing 15 L [place] for mobility, still desaturating when mobilizing. Pt demonstrates ability to perform bed mobility and transfers without requiring physical assistance. Pt ambulates limited distances secondary to increased WOB and oxygen saturation levels during session. Pt is agreeable to attempting further ambulation, however PT wary due to tenuous respiratory status. Despite pt education regarding his diagnosis and current barriers to d/c pt reports he expects to leave hospital today which upsets his wife. Pt will benefit from continued acute PT to address limitations in overall strength, endurance, activity tolerance, and independent mobility.     Follow Up Recommendations Home health PT    Equipment Recommendations  Wheelchair (measurements PT);Wheelchair cushion (measurements PT)    Recommendations for Other Services       Precautions / Restrictions Precautions Precautions: Fall Restrictions Weight Bearing Restrictions: No      Mobility  Bed Mobility Overal bed mobility: Needs Assistance Bed Mobility: Supine to Sit     Supine to sit: Min guard     General bed mobility comments: HOB elevated.    Transfers Overall transfer level: Needs assistance Equipment used: None Transfers: Sit to/from Stand Sit to Stand: Min guard            Ambulation/Gait Ambulation/Gait assistance: Min  guard Gait Distance (Feet): 15 Feet Assistive device: None Gait Pattern/deviations: Step-through pattern;Decreased stride length Gait velocity: reduced Gait velocity interpretation: <1.31 ft/sec, indicative of household ambulator General Gait Details: Pt limited by work of breathing.  Stairs            Wheelchair Mobility    Modified Rankin (Stroke Patients Only)       Balance Overall balance assessment: Needs assistance Sitting-balance support: Feet supported;No upper extremity supported Sitting balance-Leahy Scale: Fair     Standing balance support: During functional activity;No upper extremity supported Standing balance-Leahy Scale: Fair Standing balance comment: Pt limited by activity tolerance, including standing secondary to increased WOB and oxygen saturation levels.                             Pertinent Vitals/Pain Pain Assessment: No/denies pain    Home Living Family/patient expects to be discharged to:: Private residence Living Arrangements: Spouse/significant other Available Help at Discharge: Family;Available 24 hours/day Type of Home: House Home Access: Level entry     Home Layout: One level;Two level;Able to live on main level with bedroom/bathroom (Pt reports spending 6 months in house in [place] and 6 months at house in [place]. Houses are one level and two levels.) Home Equipment: None Additional Comments: will need to clarify stairs and D/C plan next session as pt is from out of town    Prior Function Level of Independence: Independent               Hand Dominance        Extremity/Trunk Assessment   Upper Extremity Assessment Upper Extremity  Assessment: Generalized weakness    Lower Extremity Assessment Lower Extremity Assessment: Generalized weakness    Cervical / Trunk Assessment Cervical / Trunk Assessment: Normal  Communication   Communication: No difficulties  Cognition Arousal/Alertness: Awake/alert Behavior  During Therapy: Anxious Overall Cognitive Status: Within Functional Limits for tasks assessed                                 General Comments: Pt reports that if he does not leave the hospital today, he may not make it. Pt's wife gets upset with his statement. Pt expresses desire to leave hospital today to see friends and family.      General Comments General comments (skin integrity, edema, etc.): Pt initally on 11 L HFNC upon PT entry, bumped up to 15 L [place] for activity. Pt oxygen saturation levels maintained at 88-93%. Pt returned to 11 L HFNC at end of session. Notable use of accessory muscles for breathing with functional mobility.    Exercises     Assessment/Plan    PT Assessment Patient needs continued PT services  PT Problem List Decreased strength;Decreased activity tolerance;Decreased balance;Decreased mobility;Decreased safety awareness;Decreased knowledge of precautions;Cardiopulmonary status limiting activity       PT Treatment Interventions Gait training;Stair training;Functional mobility training;Therapeutic activities;Therapeutic exercise;Balance training;Patient/family education    PT Goals (Current goals can be found in the Care Plan section)  Acute Rehab PT Goals Patient Stated Goal: To return home and see family/friends. PT Goal Formulation: With patient Time For Goal Achievement: 02/24/21 Potential to Achieve Goals: Fair    Frequency Min 3X/week   Barriers to discharge        Co-evaluation               AM-PAC PT "6 Clicks" Mobility  Outcome Measure Help needed turning from your back to your side while in a flat bed without using bedrails?: A Little Help needed moving from lying on your back to sitting on the side of a flat bed without using bedrails?: A Little Help needed moving to and from a bed to a chair (including a wheelchair)?: A Little Help needed standing up from a chair using your arms (e.g., wheelchair or bedside chair)?: A  Little Help needed to walk in hospital room?: A Little Help needed climbing 3-5 steps with a railing? : A Little 6 Click Score: 18    End of Session Equipment Utilized During Treatment: Gait belt Activity Tolerance: Treatment limited secondary to medical complications (Comment) (Oxygen saturation levels and increased work of breathing.) Patient left: in chair;with chair alarm set;with family/visitor present;with call bell/phone within reach Nurse Communication: Mobility status PT Visit Diagnosis: Unsteadiness on feet (R26.81);Other abnormalities of gait and mobility (R26.89);Muscle weakness (generalized) (M62.81)    Time: 3419-6222 PT Time Calculation (min) (ACUTE ONLY): 31 min   Charges:   PT Evaluation $PT Eval Moderate Complexity: 1 Mod PT Treatments $Therapeutic Activity: 8-22 mins        Acute Rehab  Pager: [phone]   Waldemar Dickens, SPT  02/10/2021, 1:54 PM

## 2021-02-11 LAB — CBC
HCT: 33.2 % — ABNORMAL LOW (ref 39.0–52.0)
Hemoglobin: 11 g/dL — ABNORMAL LOW (ref 13.0–17.0)
MCH: 30.1 pg (ref 26.0–34.0)
MCHC: 33.1 g/dL (ref 30.0–36.0)
MCV: 91 fL (ref 80.0–100.0)
Platelets: 235 10*3/uL (ref 150–400)
RBC: 3.65 MIL/uL — ABNORMAL LOW (ref 4.22–5.81)
RDW: 13.3 % (ref 11.5–15.5)
WBC: 14 10*3/uL — ABNORMAL HIGH (ref 4.0–10.5)
nRBC: 0 % (ref 0.0–0.2)

## 2021-02-11 LAB — BASIC METABOLIC PANEL
Anion gap: 10 (ref 5–15)
BUN: 23 mg/dL (ref 8–23)
CO2: 21 mmol/L — ABNORMAL LOW (ref 22–32)
Calcium: 8.7 mg/dL — ABNORMAL LOW (ref 8.9–10.3)
Chloride: 106 mmol/L (ref 98–111)
Creatinine, Ser: 1.39 mg/dL — ABNORMAL HIGH (ref 0.61–1.24)
GFR, Estimated: 55 mL/min — ABNORMAL LOW (ref >60)
Glucose, Bld: 136 mg/dL — ABNORMAL HIGH (ref 70–99)
Potassium: 3.7 mmol/L (ref 3.5–5.1)
Sodium: 137 mmol/L (ref 135–145)

## 2021-02-11 LAB — MAGNESIUM: Magnesium: 2.1 mg/dL (ref 1.7–2.4)

## 2021-02-11 MED ORDER — ALUM & MAG HYDROXIDE-SIMETH 200-200-20 MG/5ML PO SUSP
30.0000 mL | ORAL | Status: DC | PRN
  Administered 2021-02-11: 30 mL via ORAL
  Filled 2021-02-11: qty 30

## 2021-02-11 MED ORDER — IPRATROPIUM-ALBUTEROL 0.5-2.5 (3) MG/3ML IN SOLN
3.0000 mL | Freq: Two times a day (BID) | RESPIRATORY_TRACT | Status: DC
  Administered 2021-02-11 – 2021-02-15 (×8): 3 mL via RESPIRATORY_TRACT
  Filled 2021-02-11 (×8): qty 3

## 2021-02-11 MED ORDER — LIP MEDEX EX OINT
1.0000 "application " | TOPICAL_OINTMENT | CUTANEOUS | Status: DC | PRN
  Filled 2021-02-11: qty 7

## 2021-02-11 NOTE — Progress Notes (Signed)
PROGRESS NOTE    Ryan Gentry  JOI:[id number] DOB: 1951-01-11 DOA: 02/07/2021  PCP: Pcp, No   Brief Narrative:   Ryan Gentry is a 70 y.o. male with medical history significant for ANCA associated vasculitis, seizure disorder, generalized anxiety disorder, urinary incontinence, hyperlipidemia, presented to the  with hospital with shortness of breath, subjective fevers and chills with generalized myalgia and cough.  He also had few episodes of nausea and vomiting.  Of note, patient was traveling from [place] in route to [place].  He is on immunosuppressants with CellCept.  Patient presented to the ED and was noted to have oxygen saturation in the 80s on room air.  Patient was  given nasal cannula oxygen and was admitted to hospital.  Patient had creatinine of 1.6-1.7 in 2016 and around 1.6 in June 2021. Patient was then admitted to hospital for severe sepsis secondary to pneumonia with acute hypoxic respiratory failure.   Assessment & Plan:  Severe sepsis secondary to pneumonia Present on admission.  Negative MRSA PCR, so vancomycin was discontinued.  We will continue on cefepime and Zithromax due to underlying history of immune suppression.  Continue Pulmicort, duo nebs, oxygen supplementation, flutter valve and incentive spirometer .  Blood cultures negative so far.  Urine culture no growth.  Procalcitonin elevated.  Temperature max of 98.6 Fahrenheit.  Continue with current antibiotic.  Mild leukocytosis but on steroids.  Acute respiratory failure with hypoxia  Likely secondary to pneumonia.  Patient required nonrebreather mask followed by nasal cannula oxygen.  We will continue to wean oxygen.  Patient feels much better today.  Continue with IV antibiotics, bronchodilators.   Repeat chest x-ray done  showed worsening of pneumonia and is clinically improving.  Blood cultures negative in 4 days.  Continue IV Solu-Medrol.  Seizures  No seizures on admission.  Continue Lamictal and Neurontin.   Seizure precautions.  Lamictal level at 13.5.  Nausea & vomiting, diarrhea Significantly improved.  Continue as needed antiemetics.    GI pathogen panel was negative.  HIV was nonreactive.  Improved with Lomotil.  C. difficile was negative as well.  Hyponatremia Resolved.  Continue to hold HCTZ.  Mild hypokalemia.  Resolved after repletion.  Elevated troponin Without any chest pain or EKG changes.  Likely nonspecific elevation.  GAD (generalized anxiety disorder) -continue ativan PRN  Hypophosphatemia, hypomagnesemia on presentation. Replenished.  Magnesium of 2.1.  Check phosphate levels in a.m.  BPH (benign prostatic hyperplasia) -continue proscar and flomax  ANCA associated vasculitis  Continue CellCept and follow-up with rheumatology as outpatient.     Essential HTN -continue lisinopril and metoprolol.  Continue to monitor blood pressure.  Debility, weakness.  Patient was seen by physical therapy recommend home PT  DVT prophylaxis:  Lovenox subcu  Code Status:  Full code discussion with the family.  He however expresses that if it were to come to a point where he would need CPR or be living in a prolonged life support he would not want that.  Family Communication: None today.  Disposition:  Status is: Inpatient due to IV antibiotics, acute hypoxic respiratory failure,  Dispo: The patient is from: home               Anticipated d/c is to: Likely home with home health likely tomorrow.              Patient currently not medically stable for discharge;     Difficult to place patient: no    Consultants:   None  Procedures:  None.  Antimicrobials:  Vancomycin 2021/02/17>>02/08/21 Cefepime 2021-02-17> Zithromax 02/17/21>  Subjective: Today, patient was seen and examined at bedside.  Patient feels significantly better.  Breathing has improved.  Less cough and sputum production today.  Patient denies any nausea, vomiting, fever or chills.   Objective: Vitals:   02/11/21  0510 02/11/21 0700 02/11/21 0849 02/11/21 0851  BP:  (!) 141/93    Pulse: 88 96    Resp:  20    Temp:  97.7 F (36.5 C)    TempSrc:  Oral    SpO2: 100% 92% 96% 95%  Weight: 80 kg     Height:        Intake/Output Summary (Last 24 hours) at 02/11/2021 1107 Last data filed at 02/11/2021 0600 Gross per 24 hour  Intake 1615.1 ml  Output 225 ml  Net 1390.1 ml   Filed Weights   02/09/21 0504 02/10/21 0729 02/11/21 0510  Weight: 71.5 kg 81.6 kg 80 kg    Physical examination: General:  Average built, not in obvious distress, appears to be more comfortable today, on nasal cannula oxygen HENT:   No scleral pallor or icterus noted. Oral mucosa is moist.  Chest: Coarse breath sounds bilaterally.  No wheezes. CVS: S1 &S2 heard. No murmur.  Regular rate and rhythm. Abdomen: Soft, nontender, nondistended.  Bowel sounds are heard.   Extremities: No cyanosis, clubbing or edema.  Peripheral pulses are palpable. Psych: Alert, awake and oriented, normal mood CNS:  No cranial nerve deficits.  Power equal in all extremities.   Skin: Warm and dry.  No rashes noted.   Data Reviewed: I have personally reviewed the following labs and imaging studies.   CBC: Recent Labs  Lab 02-17-21 1620 02/08/21 0017 02/09/21 0247 02/10/21 0406 02/11/21 0255  WBC 13.5* 10.5 8.5 7.6 14.0*  NEUTROABS 12.5* 9.8*  --   --   --   HGB 11.2* 10.5* 9.9* 9.8* 11.0*  HCT 34.8* 32.3* 30.9* 30.7* 33.2*  MCV 95.1 92.8 94.8 93.3 91.0  PLT 158 139* 162 179 235    Basic Metabolic Panel: Recent Labs  Lab 02-17-2021 1620 02/08/21 0017 02/09/21 0247 02/10/21 0406 02/11/21 0255  NA 133* 134* 135 137 137  K 3.6 3.4* 3.4* 4.1 3.7  CL 102 107 107 106 106  CO2 22 18* 23 22 21*  GLUCOSE 162* 138* 120* 174* 136*  BUN 23 23 19 19 23   CREATININE 1.81* 1.54* 1.41* 1.38* 1.39*  CALCIUM 8.6* 7.9* 8.0* 8.2* 8.7*  MG  --  1.6*  1.5* 2.2 2.0 2.1  PHOS  --  1.9* 2.9 2.3*  --     GFR: Estimated Creatinine Clearance: 51.1  mL/min (A) (by C-G formula based on SCr of 1.39 mg/dL (H)).  Liver Function Tests: Recent Labs  Lab 02/17/21 1620 02/08/21 0017  AST 17 13*  ALT 14 14  ALKPHOS 90 51  BILITOT 0.7 0.9  PROT 5.5* 5.3*  ALBUMIN 3.0* 2.8*    CBG: No results for input(s): GLUCAP in the last 168 hours.   Recent Results (from the past 240 hour(s))  Blood Culture (routine x 2)     Status: None (Preliminary result)   Collection Time: 02/17/21  4:20 PM   Specimen: BLOOD LEFT WRIST  Result Value Ref Range Status   Specimen Description BLOOD LEFT WRIST  Final   Special Requests   Final    BOTTLES DRAWN AEROBIC AND ANAEROBIC Blood Culture adequate volume   Culture   Final  NO GROWTH 4 DAYS Performed at [facility], [address] [place], [place] [address]    Report Status PENDING  Incomplete  Blood Culture (routine x 2)     Status: None (Preliminary result)   Collection Time: 02/07/21  4:27 PM   Specimen: BLOOD LEFT FOREARM  Result Value Ref Range Status   Specimen Description BLOOD LEFT FOREARM  Final   Special Requests   Final    BOTTLES DRAWN AEROBIC AND ANAEROBIC Blood Culture adequate volume   Culture   Final    NO GROWTH 4 DAYS Performed at [facility], [address] [place], [place] [address]    Report Status PENDING  Incomplete  Resp Panel by RT-PCR (Flu A&B, Covid) Nasopharyngeal Swab     Status: None   Collection Time: 02/07/21  4:42 PM   Specimen: Nasopharyngeal Swab; Nasopharyngeal(NP) swabs in vial transport medium  Result Value Ref Range Status   SARS Coronavirus 2 by RT PCR NEGATIVE NEGATIVE Final    Comment: (NOTE) SARS-CoV-2 target nucleic acids are NOT DETECTED.  The SARS-CoV-2 RNA is generally detectable in upper respiratory specimens during the acute phase of infection. The lowest concentration of SARS-CoV-2 viral copies this assay can detect is 138 copies/mL. A negative result does not preclude SARS-Cov-2 infection and should not be used as the sole  basis for treatment or other patient management decisions. A negative result may occur with  improper specimen collection/handling, submission of specimen other than nasopharyngeal swab, presence of viral mutation(s) within the areas targeted by this assay, and inadequate number of viral copies(<138 copies/mL). A negative result must be combined with clinical observations, patient history, and epidemiological information. The expected result is Negative.  Fact Sheet for Patients:  BloggerCourse.com  Fact Sheet for Healthcare Providers:  SeriousBroker.it  This test is no t yet approved or cleared by the [place] FDA and  has been authorized for detection and/or diagnosis of SARS-CoV-2 by FDA under an Emergency Use Authorization (EUA). This EUA will remain  in effect (meaning this test can be used) for the duration of the COVID-19 declaration under Section 564(b)(1) of the Act, 21 U.S.C.section 360bbb-3(b)(1), unless the authorization is terminated  or revoked sooner.       Influenza A by PCR NEGATIVE NEGATIVE Final   Influenza B by PCR NEGATIVE NEGATIVE Final    Comment: (NOTE) The Xpert Xpress SARS-CoV-2/FLU/RSV plus assay is intended as an aid in the diagnosis of influenza from Nasopharyngeal swab specimens and should not be used as a sole basis for treatment. Nasal washings and aspirates are unacceptable for Xpert Xpress SARS-CoV-2/FLU/RSV testing.  Fact Sheet for Patients: BloggerCourse.com  Fact Sheet for Healthcare Providers: SeriousBroker.it  This test is not yet approved or cleared by the [place] FDA and has been authorized for detection and/or diagnosis of SARS-CoV-2 by FDA under an Emergency Use Authorization (EUA). This EUA will remain in effect (meaning this test can be used) for the duration of the COVID-19 declaration under Section 564(b)(1) of the Act,  21 U.S.C. section 360bbb-3(b)(1), unless the authorization is terminated or revoked.  Performed at [facility], [address], [place], [place] [address]   Urine culture     Status: None   Collection Time: 02/07/21  6:20 PM   Specimen: Urine, Random  Result Value Ref Range Status   Specimen Description URINE, RANDOM  Final   Special Requests NONE  Final   Culture   Final  NO GROWTH Performed at [facility], [address], [place], [place] [address]    Report Status 02/09/2021 FINAL  Final  MRSA PCR Screening     Status: None   Collection Time: 02/07/21  9:40 PM   Specimen: Nasal Mucosa; Nasopharyngeal  Result Value Ref Range Status   MRSA by PCR NEGATIVE NEGATIVE Final    Comment:        The GeneXpert MRSA Assay (FDA approved for NASAL specimens only), is one component of a comprehensive MRSA colonization surveillance program. It is not intended to diagnose MRSA infection nor to guide or monitor treatment for MRSA infections. Performed at [facility], [address] [place], [place] [address]   Gastrointestinal Panel by PCR , Stool     Status: None   Collection Time: 02/07/21 11:29 PM   Specimen: Nasal Mucosa; Stool  Result Value Ref Range Status   Campylobacter species NOT DETECTED NOT DETECTED Final   Plesimonas shigelloides NOT DETECTED NOT DETECTED Final   Salmonella species NOT DETECTED NOT DETECTED Final   Yersinia enterocolitica NOT DETECTED NOT DETECTED Final   Vibrio species NOT DETECTED NOT DETECTED Final   Vibrio cholerae NOT DETECTED NOT DETECTED Final   Enteroaggregative E coli (EAEC) NOT DETECTED NOT DETECTED Final   Enteropathogenic E coli (EPEC) NOT DETECTED NOT DETECTED Final   Enterotoxigenic E coli (ETEC) NOT DETECTED NOT DETECTED Final   Shiga like toxin producing E coli (STEC) NOT DETECTED NOT DETECTED Final   Shigella/Enteroinvasive E coli (EIEC) NOT DETECTED NOT DETECTED Final   Cryptosporidium NOT DETECTED NOT  DETECTED Final   Cyclospora cayetanensis NOT DETECTED NOT DETECTED Final   Entamoeba histolytica NOT DETECTED NOT DETECTED Final   Giardia lamblia NOT DETECTED NOT DETECTED Final   Adenovirus F40/41 NOT DETECTED NOT DETECTED Final   Astrovirus NOT DETECTED NOT DETECTED Final   Norovirus GI/GII NOT DETECTED NOT DETECTED Final   Rotavirus A NOT DETECTED NOT DETECTED Final   Sapovirus (I, II, IV, and V) NOT DETECTED NOT DETECTED Final    Comment: Performed at [facility], [address] [place], [place] [address]  C Difficile Quick Screen w PCR reflex     Status: None   Collection Time: 02/09/21 11:33 AM   Specimen: STOOL  Result Value Ref Range Status   C Diff antigen NEGATIVE NEGATIVE Final   C Diff toxin NEGATIVE NEGATIVE Final   C Diff interpretation No C. difficile detected.  Final    Comment: Performed at [facility], [address] [place], [place] [address]  Respiratory (~20 pathogens) panel by PCR     Status: None   Collection Time: 02/09/21  5:18 PM   Specimen: Nasopharyngeal Swab; Respiratory  Result Value Ref Range Status   Adenovirus NOT DETECTED NOT DETECTED Final   Coronavirus 229E NOT DETECTED NOT DETECTED Final    Comment: (NOTE) The Coronavirus on the Respiratory Panel, DOES NOT test for the novel  Coronavirus (2019 nCoV)    Coronavirus HKU1 NOT DETECTED NOT DETECTED Final   Coronavirus NL63 NOT DETECTED NOT DETECTED Final   Coronavirus OC43 NOT DETECTED NOT DETECTED Final   Metapneumovirus NOT DETECTED NOT DETECTED Final   Rhinovirus / Enterovirus NOT DETECTED NOT DETECTED Final   Influenza A NOT DETECTED NOT DETECTED Final   Influenza B NOT DETECTED NOT DETECTED Final   Parainfluenza Virus 1 NOT DETECTED NOT DETECTED Final   Parainfluenza Virus 2 NOT DETECTED NOT DETECTED Final  Parainfluenza Virus 3 NOT DETECTED NOT DETECTED Final   Parainfluenza Virus 4 NOT DETECTED NOT DETECTED Final   Respiratory Syncytial Virus NOT DETECTED NOT  DETECTED Final   Bordetella pertussis NOT DETECTED NOT DETECTED Final   Bordetella Parapertussis NOT DETECTED NOT DETECTED Final   Chlamydophila pneumoniae NOT DETECTED NOT DETECTED Final   Mycoplasma pneumoniae NOT DETECTED NOT DETECTED Final    Comment: Performed at [facility], [address] [place], [place] [address]     Radiology Studies: DG CHEST PORT 1 VIEW  Result Date: 02/09/2021 CLINICAL DATA:  Dyspnea. EXAM: PORTABLE CHEST 1 VIEW COMPARISON:  Single-view of the chest 02/07/2021. PA and lateral chest 12/15/2014. 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 There is right much worse than left basilar airspace disease most consistent with pneumonia. Heart size is normal. Aortic atherosclerosis. No pneumothorax or pleural fluid. Loop recorder noted. No acute or focal bony abnormality. IMPRESSION: Right worse than left basilar airspace disease most consistent with pneumonia. Aortic Atherosclerosis (ICD10-I70.0) and Emphysema (ICD10-J43.9). Electronically Signed   By: Drusilla Kannerhomas  Dalessio M.D.   On: 02/09/2021 17:51     Scheduled Meds: . budesonide (PULMICORT) nebulizer solution  0.5 mg Nebulization BID  . enoxaparin (LOVENOX) injection  40 mg Subcutaneous Q24H  . finasteride  5 mg Oral Daily  . gabapentin  300 mg Oral BID  . ipratropium-albuterol  3 mL Nebulization Q6H  . lamoTRIgine  400 mg Oral BID  . lisinopril  2.5 mg Oral Daily  . methylPREDNISolone (SOLU-MEDROL) injection  40 mg Intravenous Q12H  . metoprolol tartrate  37.5 mg Oral BID  . mycophenolate  500 mg Oral BID  . saccharomyces boulardii  250 mg Oral BID  . simvastatin  40 mg Oral Daily   Continuous Infusions: . azithromycin 250 mL/hr at 02/10/21 2133  . ceFEPime (MAXIPIME) IV 2 g (02/11/21 1003)     LOS: 4 days    Joycelyn DasLaxman Fahim Kats, MD [facility] 02/11/2021, 11:07 AM

## 2021-02-11 NOTE — Evaluation (Signed)
Occupational Therapy Evaluation Patient Details Name: Ryan Gentry MRN: [id number] DOB: 03-May-1951 Today's Date: 02/11/2021    History of Present Illness 70 y.o. male presents to [facility] ED on 02/07/2021 with complaints of SOB. Pt found to be septic 2/2 RLL PNA. Pt also reports recent history of vomiting and diarrhea. PMH includes ANCA associated vasculitis, seizure, generalized anxitey disorder, urinary incontinence, HLD.   Clinical Impression   Pt admitted to ED for concerns listed above. PTA pt was independent with all ADL's and IADL's. Wife is with pt 24/7 to assist as needed. Pt and wife were on the way to [place] from [place], as they have a home in both locations. At the time of the evaluation, pt demonstrated increased balance concerns, decrease in safety awareness, and decreased activity tolerance. Pt would benefit from CIR to improve these concerns and others listed below prior to finishing trip back to [place]. OT will continue to follow to address all concerns listed below.     Follow Up Recommendations  CIR    Equipment Recommendations  Other (comment) (TBD)    Recommendations for Other Services Rehab consult     Precautions / Restrictions Precautions Precautions: Fall Restrictions Weight Bearing Restrictions: No      Mobility Bed Mobility               General bed mobility comments: In recliner on arrival    Transfers Overall transfer level: Needs assistance Equipment used: Rolling walker (2 wheeled) Transfers: Sit to/from Stand Sit to Stand: Min guard         General transfer comment: PT lost balance on initial stand from recliner and BSC.    Balance Overall balance assessment: Needs assistance Sitting-balance support: Feet supported;No upper extremity supported Sitting balance-Leahy Scale: Good     Standing balance support: Bilateral upper extremity supported Standing balance-Leahy Scale: Poor Standing balance comment: Pt motivated to walk and work on  O2 levels to get off O2                           ADL either performed or assessed with clinical judgement   ADL Overall ADL's : Needs assistance/impaired Eating/Feeding: Independent;Sitting   Grooming: Wash/dry face;Oral care;Brushing hair;Set up;Sitting Grooming Details (indicate cue type and reason): completed in recliner Upper Body Bathing: Set up;Min guard;Sitting Upper Body Bathing Details (indicate cue type and reason): completed in recliner Lower Body Bathing: Min guard;Cueing for safety;Sitting/lateral leans;Sit to/from stand Lower Body Bathing Details (indicate cue type and reason): completed in recliner/standing at recliner Upper Body Dressing : Set up;Sitting Upper Body Dressing Details (indicate cue type and reason): completed in recliner Lower Body Dressing: Min guard;Sit to/from stand;Sitting/lateral leans Lower Body Dressing Details (indicate cue type and reason): completed in recliner Toilet Transfer: Min guard;Ambulation;Regular Toilet;RW Statistician Details (indicate cue type and reason): completed in bathroom Toileting- Clothing Manipulation and Hygiene: Min guard;Sitting/lateral lean;Sit to/from stand Toileting - Clothing Manipulation Details (indicate cue type and reason): completed sitting and standing from toilet Tub/ Shower Transfer: Cabin crew Details (indicate cue type and reason): min guard for safety simulated with bed<>recliner Functional mobility during ADLs: Min guard;Minimal assistance;Rolling walker General ADL Comments: W/ RW requires min guard for safety, w/out RW requires min Assist due to balance deficits.     Vision Baseline Vision/History: No visual deficits Patient Visual Report: No change from baseline Vision Assessment?: No apparent visual deficits     Perception Perception Perception Tested?: No   Praxis  Praxis Praxis tested?: Not tested    Pertinent Vitals/Pain Pain Assessment: No/denies  pain     Hand Dominance Right   Extremity/Trunk Assessment Upper Extremity Assessment Upper Extremity Assessment: Generalized weakness   Lower Extremity Assessment Lower Extremity Assessment: Defer to PT evaluation   Cervical / Trunk Assessment Cervical / Trunk Assessment: Normal   Communication Communication Communication: No difficulties   Cognition Arousal/Alertness: Awake/alert Behavior During Therapy: WFL for tasks assessed/performed Overall Cognitive Status: Impaired/Different from baseline Area of Impairment: Safety/judgement;Awareness;Attention;Problem solving                   Current Attention Level: Selective     Safety/Judgement: Decreased awareness of safety Awareness: Emergent Problem Solving: Requires verbal cues General Comments: Pt is impulsive and not aware of all deficits. Reports that a miracle has been performed here and he is thankful to be alive.   General Comments  HR 98 at rest, 125 during activity. Pt O2 ranges from 87-94% increasing with deep breathing. Lowest O2 85%(pleth not stable) highest O2 98% (pleth not stable)    Exercises     Shoulder Instructions      Home Living Family/patient expects to be discharged to:: Private residence Living Arrangements: Spouse/significant other Available Help at Discharge: Family;Available 24 hours/day Type of Home: House Home Access: Level entry     Home Layout: One level;Two level;Able to live on main level with bedroom/bathroom     Bathroom Shower/Tub: Producer, television/film/video: Standard Bathroom Accessibility: No   Home Equipment: Cane - single point          Prior Functioning/Environment Level of Independence: Independent                 OT Problem List: Decreased strength;Decreased activity tolerance;Impaired balance (sitting and/or standing);Decreased coordination;Decreased safety awareness;Decreased cognition;Decreased knowledge of use of DME or AE      OT  Treatment/Interventions: Self-care/ADL training;Therapeutic exercise;Energy conservation;DME and/or AE instruction;Therapeutic activities;Cognitive remediation/compensation;Patient/family education;Balance training    OT Goals(Current goals can be found in the care plan section) Acute Rehab OT Goals Patient Stated Goal: To return home and see family/friends. OT Goal Formulation: With patient/family Time For Goal Achievement: 02/25/21 Potential to Achieve Goals: Good ADL Goals Pt Will Perform Grooming: with modified independence;standing Pt Will Perform Lower Body Dressing: with modified independence;sit to/from stand;sitting/lateral leans Pt Will Transfer to Toilet: with modified independence;ambulating;regular height toilet Pt Will Perform Toileting - Clothing Manipulation and hygiene: with modified independence;sitting/lateral leans;sit to/from stand  OT Frequency: Min 2X/week   Barriers to D/C:    Pt lives in [place] and [place], just traveling through [place].       Co-evaluation              AM-PAC OT "6 Clicks" Daily Activity     Outcome Measure Help from another person eating meals?: None Help from another person taking care of personal grooming?: A Little Help from another person toileting, which includes using toliet, bedpan, or urinal?: A Little Help from another person bathing (including washing, rinsing, drying)?: A Little Help from another person to put on and taking off regular upper body clothing?: A Little Help from another person to put on and taking off regular lower body clothing?: A Little 6 Click Score: 19   End of Session Equipment Utilized During Treatment: Gait belt;Rolling walker Nurse Communication: Mobility status;Other (comment) (O2 sats and cognition)  Activity Tolerance: Patient tolerated treatment well Patient left: in chair;with call bell/phone within reach;with chair alarm set;with  family/visitor present  OT Visit Diagnosis: Unsteadiness on feet  (R26.81);Repeated falls (R29.6);Muscle weakness (generalized) (M62.81)                Time: 2831-5176 OT Time Calculation (min): 46 min Charges:  OT General Charges $OT Visit: 1 Visit OT Evaluation $OT Eval Moderate Complexity: 1 Mod OT Treatments $Self Care/Home Management : 23-37 mins  Karley Pho H., OTR/L Acute Rehabilitation  Doyne Ellinger Elane Alyaan Budzynski 02/11/2021, 12:01 PM

## 2021-02-11 NOTE — Progress Notes (Signed)
Physical Therapy Treatment Patient Details Name: Ryan Gentry MRN: [id number] DOB: July 24, 1951 Today's Date: 02/11/2021    History of Present Illness 70 y.o. male presents to [facility] ED on 02/07/2021 with complaints of SOB. Pt found to be septic 2/2 RLL PNA. Pt also reports recent history of vomiting and diarrhea. PMH includes ANCA associated vasculitis, seizure, generalized anxitey disorder, urinary incontinence, HLD.    PT Comments    Pt demonstrates ability to complete bed mobility and sit>stand transfers without need for physical assistance. Pt demonstrates improved tolera ambulating household distances with RW improved from last session, with 2 LOB noted. Pt is able to recover from LOB needing physical assistance to prevent fall. Pt continues to demonstrate deficits in strength, endurance, power, activity tolerance, and work of breathing. Pt will benefit from continued acute PT to improve independent mobility and safety.    Follow Up Recommendations  Home health PT     Equipment Recommendations  Wheelchair (measurements PT);Wheelchair cushion (measurements PT)    Recommendations for Other Services       Precautions / Restrictions Precautions Precautions: Fall Restrictions Weight Bearing Restrictions: No    Mobility  Bed Mobility Overal bed mobility: Needs Assistance Bed Mobility: Supine to Sit     Supine to sit: Supervision;HOB elevated          Transfers Overall transfer level: Needs assistance Equipment used: None Transfers: Sit to/from Stand Sit to Stand: Min guard            Ambulation/Gait Ambulation/Gait assistance: Min guard;Min assist Gait Distance (Feet): 200 Feet (x 2 bouts) Assistive device: Rolling walker (2 wheeled) Gait Pattern/deviations: Step-through pattern;Decreased stride length Gait velocity: reduced Gait velocity interpretation: <1.31 ft/sec, indicative of household ambulator General Gait Details: Pt requires seated rest break following  first bout of ambulating 200 ft. Pt experiences 2 LOB with min A to recover during gait.   Stairs             Wheelchair Mobility    Modified Rankin (Stroke Patients Only)       Balance Overall balance assessment: Needs assistance Sitting-balance support: Feet supported;No upper extremity supported Sitting balance-Leahy Scale: Good     Standing balance support: Bilateral upper extremity supported;No upper extremity supported;During functional activity Standing balance-Leahy Scale: Poor Standing balance comment: Pt briefly tolerates static standing without UE support, experiences 2 LOB during gait with bil UE with use of RW.                            Cognition Arousal/Alertness: Awake/alert Behavior During Therapy: WFL for tasks assessed/performed Overall Cognitive Status: Within Functional Limits for tasks assessed                                        Exercises      General Comments General comments (skin integrity, edema, etc.): Pt on 3 L HFNC upon PT arrival. Pt requires 3 L  to maintain oxygen sats between 91-97% during session. Pt demonstrates increased WOB during ambulation.      Pertinent Vitals/Pain Pain Assessment: No/denies pain    Home Living                      Prior Function            PT Goals (current goals can now be found in the care plan  section) Acute Rehab PT Goals Patient Stated Goal: To return home and see family/friends. Progress towards PT goals: Progressing toward goals    Frequency    Min 3X/week      PT Plan Current plan remains appropriate    Co-evaluation              AM-PAC PT "6 Clicks" Mobility   Outcome Measure  Help needed turning from your back to your side while in a flat bed without using bedrails?: A Little Help needed moving from lying on your back to sitting on the side of a flat bed without using bedrails?: A Little Help needed moving to and from a bed to a  chair (including a wheelchair)?: A Little Help needed standing up from a chair using your arms (e.g., wheelchair or bedside chair)?: A Little Help needed to walk in hospital room?: A Little Help needed climbing 3-5 steps with a railing? : A Little 6 Click Score: 18    End of Session Equipment Utilized During Treatment: Gait belt;Oxygen Activity Tolerance: Patient limited by fatigue Patient left: in chair;with call bell/phone within reach;with chair alarm set Nurse Communication: Mobility status PT Visit Diagnosis: Unsteadiness on feet (R26.81);Other abnormalities of gait and mobility (R26.89);Muscle weakness (generalized) (M62.81)     Time: 1062-6948 PT Time Calculation (min) (ACUTE ONLY): 43 min  Charges:  $Gait Training: 8-22 mins $Therapeutic Activity: 23-37 mins                     Acute Rehab  Pager: [phone]    Waldemar Dickens, SPT  02/11/2021, 5:52 PM

## 2021-02-11 NOTE — Care Management Important Message (Signed)
Important Message  Patient Details  Name: Ryan Gentry MRN: [id number] Date of Birth: December 15, 1950   Medicare Important Message Given:  Yes     Renie Ora 02/11/2021, 10:13 AM

## 2021-02-12 LAB — MAGNESIUM: Magnesium: 2.2 mg/dL (ref 1.7–2.4)

## 2021-02-12 LAB — CBC
HCT: 35.4 % — ABNORMAL LOW (ref 39.0–52.0)
Hemoglobin: 11.3 g/dL — ABNORMAL LOW (ref 13.0–17.0)
MCH: 29.7 pg (ref 26.0–34.0)
MCHC: 31.9 g/dL (ref 30.0–36.0)
MCV: 93.2 fL (ref 80.0–100.0)
Platelets: 277 10*3/uL (ref 150–400)
RBC: 3.8 MIL/uL — ABNORMAL LOW (ref 4.22–5.81)
RDW: 13.6 % (ref 11.5–15.5)
WBC: 11.6 10*3/uL — ABNORMAL HIGH (ref 4.0–10.5)
nRBC: 0 % (ref 0.0–0.2)

## 2021-02-12 LAB — BASIC METABOLIC PANEL
Anion gap: 13 (ref 5–15)
BUN: 28 mg/dL — ABNORMAL HIGH (ref 8–23)
CO2: 20 mmol/L — ABNORMAL LOW (ref 22–32)
Calcium: 8.8 mg/dL — ABNORMAL LOW (ref 8.9–10.3)
Chloride: 106 mmol/L (ref 98–111)
Creatinine, Ser: 1.47 mg/dL — ABNORMAL HIGH (ref 0.61–1.24)
GFR, Estimated: 51 mL/min — ABNORMAL LOW (ref >60)
Glucose, Bld: 159 mg/dL — ABNORMAL HIGH (ref 70–99)
Potassium: 3.8 mmol/L (ref 3.5–5.1)
Sodium: 139 mmol/L (ref 135–145)

## 2021-02-12 LAB — CULTURE, BLOOD (ROUTINE X 2)
Culture: NO GROWTH
Culture: NO GROWTH
Special Requests: ADEQUATE
Special Requests: ADEQUATE

## 2021-02-12 LAB — PHOSPHORUS: Phosphorus: 3.4 mg/dL (ref 2.5–4.6)

## 2021-02-12 MED ORDER — METHYLPREDNISOLONE SODIUM SUCC 40 MG IJ SOLR
40.0000 mg | Freq: Every day | INTRAMUSCULAR | Status: DC
  Administered 2021-02-13 – 2021-02-14 (×2): 40 mg via INTRAVENOUS
  Filled 2021-02-12 (×2): qty 1

## 2021-02-12 NOTE — TOC Transition Note (Signed)
Transition of Care [facility]) - CM/SW Discharge Note   Patient Details  Name: Ryan Gentry MRN: [id number] Date of Birth: 1951/02/15  [facility]) CM/SW Contact:  Norvel Richards, RN Phone Number: 02/12/2021, 4:52 PM   Clinical Narrative: [facility] team for discharge planning. Spoke to patient and spouse at bedside regarding discharge plan. Discussed home health services for PT. Pt shares he is currently staying in [place] until Feb 23, 2021. Assured him services can be rendered at the hotel. Hotel address- [address] [place]. [place] [address]. Call to [facility] to arrange PT. Spoke to [facility]. PT has recommended DME- wheelchair and cushion. Ordered DME from [employer]. Will continue to monitor for discharge needs.    Final next level of care: Home w Home Health Services Barriers to Discharge: No Barriers Identified   Patient Goals and CMS Choice Patient states their goals for this hospitalization and ongoing recovery are:: Return to hotel with wife. CMS Medicare.gov Compare Post Acute Care list provided to:: Patient Choice offered to / list presented to : [facility]  Discharge Placement                       Discharge Plan and Services                DME Arranged: Wheelchair manual (Wheelchair cushion.) DME Agency: [employer] Date DME Agency Contacted: 02/12/21 Time DME Agency Contacted: 1640 Representative spoke with at DME Agency: [facility]- [phone] [facility] Arranged: PT HH Agency: [facility] Date [facility] Agency Contacted: 02/12/21 Time HH Agency Contacted: 1615 Representative spoke with at [facility] Agency: Kandee [phone]  Social Determinants of Health (SDOH) Interventions     Readmission Risk Interventions No flowsheet data found.

## 2021-02-12 NOTE — Plan of Care (Signed)
  Problem: Fluid Volume: Goal: Hemodynamic stability will improve Outcome: Progressing   Problem: Clinical Measurements: Goal: Signs and symptoms of infection will decrease Outcome: Progressing   Problem: Clinical Measurements: Goal: Ability to maintain clinical measurements within normal limits will improve Outcome: Progressing Goal: Will remain free from infection Outcome: Progressing Goal: Diagnostic test results will improve Outcome: Progressing

## 2021-02-12 NOTE — Progress Notes (Signed)
Cross-coverage note:   Patient seen for hyperactive delirium. He is not in any apparent distress, no focal deficits noted, is redirectable. Likely related to acute illness, possibly medications. Plan to institute delirium precautions.

## 2021-02-12 NOTE — Progress Notes (Signed)
PROGRESS NOTE    Ryan Gentry  FEO:[id number] DOB: 1951-04-07 DOA: 02/07/2021  PCP: Pcp, No   Brief Narrative:   Ryan Gentry is a 70 y.o. male with medical history significant for ANCA associated vasculitis, seizure disorder, generalized anxiety disorder, urinary incontinence, hyperlipidemia, presented to the  with hospital with shortness of breath, subjective fevers and chills with generalized myalgia and cough.  He also had few episodes of nausea and vomiting.  Of note, patient was traveling from [place] in route to [place].  He is on immunosuppressants with CellCept.  Patient presented to the ED and was noted to have oxygen saturation in the 80s on room air.  Patient was  given nasal cannula oxygen and was admitted to hospital.  Patient had creatinine of 1.6-1.7 in 2016 and around 1.6 in June 2021. Patient was then admitted to hospital for severe sepsis secondary to pneumonia with acute hypoxic respiratory failure.   Assessment & Plan:  Severe sepsis secondary to pneumonia Present on admission.  Negative MRSA PCR, so vancomycin was discontinued.  We will continue on cefepime and Zithromax due to underlying history of immune suppression.  Continue Pulmicort, duo nebs, oxygen supplementation, flutter valve and incentive spirometer .  Blood cultures negative so far.  Urine culture no growth.  Procalcitonin elevated.  Temperature max of 98.7 Fahrenheit.  Continue with current antibiotic.  Mild leukocytosis but on steroids.  Acute respiratory failure with hypoxia  Likely secondary to pneumonia.  Patient required nonrebreather mask followed by nasal cannula oxygen.  We will continue to wean oxygen.  Patient feels much better today.  Continue with IV antibiotics, bronchodilators.   Repeat chest x-ray done  showed worsening of pneumonia and is clinically improving.  Blood cultures negative in 4 days.  Continue IV Solu-Medrol but will change to once a day.  Continue to wean oxygen.  Currently on room  air and saturating around 94%  Seizures  No seizures on admission.  Continue Lamictal and Neurontin.  Seizure precautions.  Lamictal level at 13.5.  Nausea & vomiting, diarrhea Resolved  Hyponatremia Resolved.  Continue to hold HCTZ.  Mild hypokalemia.  Resolved after repletion.  Latest potassium of 3.8.  Elevated troponin Without any chest pain or EKG changes.  Likely nonspecific elevation.  GAD (generalized anxiety disorder) -continue ativan PRN  Hypophosphatemia, hypomagnesemia on presentation. Replenished.   BPH (benign prostatic hyperplasia) -continue proscar and flomax  ANCA associated vasculitis  Continue CellCept and follow-up with rheumatology as outpatient.     Essential HTN -continue lisinopril and metoprolol.  Continue to monitor blood pressure.  Blood pressure is controlled at this time.  Debility, weakness.  Patient was seen by physical therapy recommend home PT on discharge.  Will consult TOC  DVT prophylaxis:  Lovenox subcu  Code Status:  Full code..  He however expresses that if it were to come to a point where he would need CPR or be living in a prolonged life support he would not want that.  Family Communication: Spoke with the patient's wife on the phone in detail regarding possible disposition in a.m.  Disposition:  Status is: Inpatient due to IV antibiotics, acute hypoxic respiratory failure,  Dispo: The patient is from: home               Anticipated d/c is to: Likely home with home health likely by tomorrow if he continues to improve.              Patient currently not medically stable for discharge;  Difficult to place patient: no    Consultants:   None  Procedures:  None.  Antimicrobials:  Vancomycin 03/04/21>>02/08/21 Cefepime 2021-03-04> Zithromax 2021-03-04>  Subjective: Today, patient was seen and examined at bedside.  Patient was little hyperactive this morning and was trying to call people.  He however feels good in terms of  breathing.  Has been gradually weaned to room air.  Has less cough and sputum production.  Denies any nausea vomiting fever or chills.    Objective: Vitals:   02/12/21 0109 02/12/21 0425 02/12/21 0800 02/12/21 0842  BP: 127/86 139/84  131/89  Pulse: 92 92  100  Resp: 18 20  20   Temp: 97.7 F (36.5 C) 98.7 F (37.1 C)  97.8 F (36.6 C)  TempSrc: Oral Oral  Oral  SpO2: 90% 90% 92% 94%  Weight:  80.9 kg    Height:        Intake/Output Summary (Last 24 hours) at 02/12/2021 1353 Last data filed at 02/12/2021 0430 Gross per 24 hour  Intake 1067.21 ml  Output 400 ml  Net 667.21 ml   Filed Weights   02/10/21 0729 02/11/21 0510 02/12/21 0425  Weight: 81.6 kg 80 kg 80.9 kg    Physical examination: General:  Average built, not in obvious distress, on room air HENT:   No scleral pallor or icterus noted. Oral mucosa is moist.  Chest: Coarse breath sounds bilaterally.  No wheezes. CVS: S1 &S2 heard. No murmur.  Regular rate and rhythm. Abdomen: Soft, nontender, nondistended.  Bowel sounds are heard.   Extremities: No cyanosis, clubbing or edema.  Peripheral pulses are palpable. Psych: Alert, awake and oriented, communicative, CNS:  No cranial nerve deficits.  Power equal in all extremities.   Skin: Warm and dry.  No rashes noted.   Data Reviewed: I have personally reviewed the following labs and imaging studies.   CBC: Recent Labs  Lab 2021/03/04 1620 02/08/21 0017 02/09/21 0247 02/10/21 0406 02/11/21 0255 02/12/21 0240  WBC 13.5* 10.5 8.5 7.6 14.0* 11.6*  NEUTROABS 12.5* 9.8*  --   --   --   --   HGB 11.2* 10.5* 9.9* 9.8* 11.0* 11.3*  HCT 34.8* 32.3* 30.9* 30.7* 33.2* 35.4*  MCV 95.1 92.8 94.8 93.3 91.0 93.2  PLT 158 139* 162 179 235 277    Basic Metabolic Panel: Recent Labs  Lab 02/08/21 0017 02/09/21 0247 02/10/21 0406 02/11/21 0255 02/12/21 0240  NA 134* 135 137 137 139  K 3.4* 3.4* 4.1 3.7 3.8  CL 107 107 106 106 106  CO2 18* 23 22 21* 20*  GLUCOSE 138* 120*  174* 136* 159*  BUN 23 19 19 23  28*  CREATININE 1.54* 1.41* 1.38* 1.39* 1.47*  CALCIUM 7.9* 8.0* 8.2* 8.7* 8.8*  MG 1.6*  1.5* 2.2 2.0 2.1 2.2  PHOS 1.9* 2.9 2.3*  --  3.4    GFR: Estimated Creatinine Clearance: 48.3 mL/min (A) (by C-G formula based on SCr of 1.47 mg/dL (H)).  Liver Function Tests: Recent Labs  Lab 03-04-2021 1620 02/08/21 0017  AST 17 13*  ALT 14 14  ALKPHOS 90 51  BILITOT 0.7 0.9  PROT 5.5* 5.3*  ALBUMIN 3.0* 2.8*    CBG: No results for input(s): GLUCAP in the last 168 hours.   Recent Results (from the past 240 hour(s))  Blood Culture (routine x 2)     Status: None   Collection Time: 03/04/2021  4:20 PM   Specimen: BLOOD LEFT WRIST  Result Value Ref Range  Status   Specimen Description BLOOD LEFT WRIST  Final   Special Requests   Final    BOTTLES DRAWN AEROBIC AND ANAEROBIC Blood Culture adequate volume   Culture   Final    NO GROWTH 5 DAYS Performed at [facility], [address], [place], [place] [address]    Report Status 02/12/2021 FINAL  Final  Blood Culture (routine x 2)     Status: None   Collection Time: 02/07/21  4:27 PM   Specimen: BLOOD LEFT FOREARM  Result Value Ref Range Status   Specimen Description BLOOD LEFT FOREARM  Final   Special Requests   Final    BOTTLES DRAWN AEROBIC AND ANAEROBIC Blood Culture adequate volume   Culture   Final    NO GROWTH 5 DAYS Performed at [facility], [address] [place], [place] [address]    Report Status 02/12/2021 FINAL  Final  Resp Panel by RT-PCR (Flu A&B, Covid) Nasopharyngeal Swab     Status: None   Collection Time: 02/07/21  4:42 PM   Specimen: Nasopharyngeal Swab; Nasopharyngeal(NP) swabs in vial transport medium  Result Value Ref Range Status   SARS Coronavirus 2 by RT PCR NEGATIVE NEGATIVE Final    Comment: (NOTE) SARS-CoV-2 target nucleic acids are NOT DETECTED.  The SARS-CoV-2 RNA is generally detectable in upper respiratory specimens during the acute phase of  infection. The lowest concentration of SARS-CoV-2 viral copies this assay can detect is 138 copies/mL. A negative result does not preclude SARS-Cov-2 infection and should not be used as the sole basis for treatment or other patient management decisions. A negative result may occur with  improper specimen collection/handling, submission of specimen other than nasopharyngeal swab, presence of viral mutation(s) within the areas targeted by this assay, and inadequate number of viral copies(<138 copies/mL). A negative result must be combined with clinical observations, patient history, and epidemiological information. The expected result is Negative.  Fact Sheet for Patients:  BloggerCourse.com  Fact Sheet for Healthcare Providers:  SeriousBroker.it  This test is no t yet approved or cleared by the [place] FDA and  has been authorized for detection and/or diagnosis of SARS-CoV-2 by FDA under an Emergency Use Authorization (EUA). This EUA will remain  in effect (meaning this test can be used) for the duration of the COVID-19 declaration under Section 564(b)(1) of the Act, 21 U.S.C.section 360bbb-3(b)(1), unless the authorization is terminated  or revoked sooner.       Influenza A by PCR NEGATIVE NEGATIVE Final   Influenza B by PCR NEGATIVE NEGATIVE Final    Comment: (NOTE) The Xpert Xpress SARS-CoV-2/FLU/RSV plus assay is intended as an aid in the diagnosis of influenza from Nasopharyngeal swab specimens and should not be used as a sole basis for treatment. Nasal washings and aspirates are unacceptable for Xpert Xpress SARS-CoV-2/FLU/RSV testing.  Fact Sheet for Patients: BloggerCourse.com  Fact Sheet for Healthcare Providers: SeriousBroker.it  This test is not yet approved or cleared by the [place] FDA and has been authorized for detection and/or diagnosis of SARS-CoV-2  by FDA under an Emergency Use Authorization (EUA). This EUA will remain in effect (meaning this test can be used) for the duration of the COVID-19 declaration under Section 564(b)(1) of the Act, 21 U.S.C. section 360bbb-3(b)(1), unless the authorization is terminated or revoked.  Performed at [facility], [address], [place], [place] [address]   Urine culture     Status: None   Collection Time: 02/07/21  6:20 PM   Specimen: Urine, Random  Result Value Ref Range Status   Specimen Description URINE, RANDOM  Final   Special Requests NONE  Final   Culture   Final    NO GROWTH Performed at [facility], [address] [place], [place] [address]    Report Status 02/09/2021 FINAL  Final  MRSA PCR Screening     Status: None   Collection Time: 02/07/21  9:40 PM   Specimen: Nasal Mucosa; Nasopharyngeal  Result Value Ref Range Status   MRSA by PCR NEGATIVE NEGATIVE Final    Comment:        The GeneXpert MRSA Assay (FDA approved for NASAL specimens only), is one component of a comprehensive MRSA colonization surveillance program. It is not intended to diagnose MRSA infection nor to guide or monitor treatment for MRSA infections. Performed at [facility], [address] [place], [place] [address]   Gastrointestinal Panel by PCR , Stool     Status: None   Collection Time: 02/07/21 11:29 PM   Specimen: Nasal Mucosa; Stool  Result Value Ref Range Status   Campylobacter species NOT DETECTED NOT DETECTED Final   Plesimonas shigelloides NOT DETECTED NOT DETECTED Final   Salmonella species NOT DETECTED NOT DETECTED Final   Yersinia enterocolitica NOT DETECTED NOT DETECTED Final   Vibrio species NOT DETECTED NOT DETECTED Final   Vibrio cholerae NOT DETECTED NOT DETECTED Final   Enteroaggregative E coli (EAEC) NOT DETECTED NOT DETECTED Final   Enteropathogenic E coli (EPEC) NOT DETECTED NOT DETECTED Final   Enterotoxigenic E coli (ETEC) NOT DETECTED NOT DETECTED  Final   Shiga like toxin producing E coli (STEC) NOT DETECTED NOT DETECTED Final   Shigella/Enteroinvasive E coli (EIEC) NOT DETECTED NOT DETECTED Final   Cryptosporidium NOT DETECTED NOT DETECTED Final   Cyclospora cayetanensis NOT DETECTED NOT DETECTED Final   Entamoeba histolytica NOT DETECTED NOT DETECTED Final   Giardia lamblia NOT DETECTED NOT DETECTED Final   Adenovirus F40/41 NOT DETECTED NOT DETECTED Final   Astrovirus NOT DETECTED NOT DETECTED Final   Norovirus GI/GII NOT DETECTED NOT DETECTED Final   Rotavirus A NOT DETECTED NOT DETECTED Final   Sapovirus (I, II, IV, and V) NOT DETECTED NOT DETECTED Final    Comment: Performed at [facility], [address] [place], [place] [address]  C Difficile Quick Screen w PCR reflex     Status: None   Collection Time: 02/09/21 11:33 AM   Specimen: STOOL  Result Value Ref Range Status   C Diff antigen NEGATIVE NEGATIVE Final   C Diff toxin NEGATIVE NEGATIVE Final   C Diff interpretation No C. difficile detected.  Final    Comment: Performed at [facility], [address] [place], [place] [address]  Respiratory (~20 pathogens) panel by PCR     Status: None   Collection Time: 02/09/21  5:18 PM   Specimen: Nasopharyngeal Swab; Respiratory  Result Value Ref Range Status   Adenovirus NOT DETECTED NOT DETECTED Final   Coronavirus 229E NOT DETECTED NOT DETECTED Final    Comment: (NOTE) The Coronavirus on the Respiratory Panel, DOES NOT test for the novel  Coronavirus (2019 nCoV)    Coronavirus HKU1 NOT DETECTED NOT DETECTED Final   Coronavirus NL63 NOT DETECTED NOT DETECTED Final   Coronavirus OC43 NOT DETECTED NOT DETECTED Final   Metapneumovirus NOT DETECTED NOT DETECTED Final   Rhinovirus / Enterovirus NOT DETECTED NOT DETECTED Final   Influenza  A NOT DETECTED NOT DETECTED Final   Influenza B NOT DETECTED NOT DETECTED Final   Parainfluenza Virus 1 NOT DETECTED NOT DETECTED Final   Parainfluenza Virus 2 NOT  DETECTED NOT DETECTED Final   Parainfluenza Virus 3 NOT DETECTED NOT DETECTED Final   Parainfluenza Virus 4 NOT DETECTED NOT DETECTED Final   Respiratory Syncytial Virus NOT DETECTED NOT DETECTED Final   Bordetella pertussis NOT DETECTED NOT DETECTED Final   Bordetella Parapertussis NOT DETECTED NOT DETECTED Final   Chlamydophila pneumoniae NOT DETECTED NOT DETECTED Final   Mycoplasma pneumoniae NOT DETECTED NOT DETECTED Final    Comment: Performed at [facility], [address] [place], [place] [address]     Radiology Studies: No results found.   Scheduled Meds: . budesonide (PULMICORT) nebulizer solution  0.5 mg Nebulization BID  . enoxaparin (LOVENOX) injection  40 mg Subcutaneous Q24H  . finasteride  5 mg Oral Daily  . gabapentin  300 mg Oral BID  . ipratropium-albuterol  3 mL Nebulization BID  . lamoTRIgine  400 mg Oral BID  . lisinopril  2.5 mg Oral Daily  . [START ON 02/13/2021] methylPREDNISolone (SOLU-MEDROL) injection  40 mg Intravenous Daily  . metoprolol tartrate  37.5 mg Oral BID  . mycophenolate  500 mg Oral BID  . saccharomyces boulardii  250 mg Oral BID  . simvastatin  40 mg Oral Daily   Continuous Infusions: . ceFEPime (MAXIPIME) IV 2 g (02/12/21 1112)     LOS: 5 days    Joycelyn Das, MD [facility] 02/12/2021, 1:53 PM

## 2021-02-13 MED ORDER — FUROSEMIDE 10 MG/ML IJ SOLN
40.0000 mg | Freq: Once | INTRAMUSCULAR | Status: AC
  Administered 2021-02-13: 40 mg via INTRAVENOUS
  Filled 2021-02-13: qty 4

## 2021-02-13 NOTE — Plan of Care (Signed)
  Problem: Clinical Measurements: Goal: Signs and symptoms of infection will decrease Outcome: Progressing   Problem: Respiratory: Goal: Ability to maintain adequate ventilation will improve Outcome: Progressing   Problem: Health Behavior/Discharge Planning: Goal: Ability to manage health-related needs will improve Outcome: Progressing   Problem: Activity: Goal: Risk for activity intolerance will decrease Outcome: Progressing

## 2021-02-13 NOTE — Progress Notes (Signed)
PROGRESS NOTE    Ryan Gentry  GGY:[id number] DOB: 04-27-1951 DOA: 02/07/2021  PCP: Pcp, No   Brief Narrative:   Ryan Gentry is a 70 y.o. male with medical history significant for ANCA associated vasculitis, seizure disorder, generalized anxiety disorder, urinary incontinence, hyperlipidemia, presented to the  with hospital with shortness of breath, subjective fevers and chills with generalized myalgia and cough.  He also had few episodes of nausea and vomiting.  Of note, patient was traveling from [place] in route to [place].  He is on immunosuppressants with CellCept.  Patient presented to the ED and was noted to have oxygen saturation in the 80s on room air.  Patient was  given nasal cannula oxygen and was admitted to hospital.  Patient had creatinine of 1.6-1.7 in 2016 and around 1.6 in June 2021. Patient was then admitted to hospital for severe sepsis secondary to pneumonia with acute hypoxic respiratory failure.   Assessment & Plan:  Severe sepsis secondary to pneumonia Present on admission.  Negative MRSA PCR, so vancomycin was discontinued.  We will continue on cefepime to complete 7-day course.  Has completed Zithromax course.  Continue Pulmicort, duo nebs, oxygen supplementation, flutter valve and incentive spirometer .  Blood cultures negative so far.  Urine culture no growth.  Procalcitonin elevated.  Temperature max of 98.7 Fahrenheit.  Mild leukocytosis.  Acute respiratory failure with hypoxia  Likely secondary to pneumonia.  Patient initially required nonrebreather mask followed by nasal cannula oxygen.  We will continue to wean oxygen.  Patient feels much better today.  Patient needed oxygen in the nighttime and is also pulse ox was around 80%.  Continue with IV antibiotics, bronchodilators.     Blood cultures negative so far.  Continue IV Solu-Medrol daily.  Continue to wean oxygen.  Currently on room air and saturating around 92%.  Patient has peripheral edema so we will give  40 mEq of IV Lasix today.  Seizures  No seizures on admission.  Continue Lamictal and Neurontin.  Seizure precautions.  Lamictal level at 13.5.  Nausea & vomiting, diarrhea Resolved  Hyponatremia Resolved.  Continue to hold HCTZ.  Mild hypokalemia.  Resolved after repletion.  Latest potassium of 3.8.  Elevated troponin Without any chest pain or EKG changes.  Likely nonspecific elevation.  GAD (generalized anxiety disorder) -continue ativan PRN  Hypophosphatemia, hypomagnesemia on presentation. Replenished.   BPH (benign prostatic hyperplasia) -continue proscar and flomax  ANCA associated vasculitis  Continue CellCept and follow-up with rheumatology as outpatient.     Essential HTN -continue lisinopril and metoprolol.  Continue to monitor blood pressure.  Blood pressure is controlled at this time.  Debility, weakness.  Patient was seen by physical therapy recommend home PT on discharge.  Will consult TOC  DVT prophylaxis:  Lovenox subcu  Code Status:  Full code.    Family Communication: Spoke with the patient's wife on the phone again today in detail.  Disposition:  Status is: Inpatient due to IV antibiotics, acute hypoxic respiratory failure,  Dispo: The patient is from: home               Anticipated d/c is to: Likely home with home health likely by tomorrow if he continues to improve.  Will likely need oxygen/home health on discharge              Patient currently not medically stable for discharge;     Difficult to place patient: no    Consultants:   None  Procedures:  None.  Antimicrobials:  Vancomycin 2021-02-08>>02/08/21 Cefepime 02-08-2021> Zithromax 2021-02-08>  Subjective: Today, patient was seen and examined at bedside.  Continues to feel better.  Nursing staff reported that he was extremely weak and deconditioned and had hypoxia in the nighttime.  Pulse ox saturation was in the 80s in the nighttime  Objective: Vitals:   02/13/21 0800 02/13/21 0847  02/13/21 0850 02/13/21 1055  BP:    133/68  Pulse: 93   88  Resp:      Temp:    98.5 F (36.9 C)  TempSrc:    Oral  SpO2: (!) 87% 91% 92% 90%  Weight:      Height:        Intake/Output Summary (Last 24 hours) at 02/13/2021 1305 Last data filed at 02/12/2021 1700 Gross per 24 hour  Intake 100 ml  Output 300 ml  Net -200 ml   Filed Weights   02/10/21 0729 02/11/21 0510 02/12/21 0425  Weight: 81.6 kg 80 kg 80.9 kg    Physical examination: General:  Average built, not in obvious distress, room air HENT:   No scleral pallor or icterus noted. Oral mucosa is moist.  Chest:    Diminished breath sounds bilaterally.  Coarse breath sounds bilaterally. CVS: S1 &S2 heard. No murmur.  Regular rate and rhythm. Abdomen: Soft, nontender, nondistended.  Bowel sounds are heard.   Extremities: No cyanosis, clubbing but trace pedal edema noted.  Peripheral pulses are palpable. Psych: Alert, awake and oriented, normal mood CNS:  No cranial nerve deficits.  Power equal in all extremities.   Skin: Warm and dry.  No rashes noted.   Data Reviewed: I have personally reviewed the following labs and imaging studies.   CBC: Recent Labs  Lab 02-08-2021 1620 02/08/21 0017 02/09/21 0247 02/10/21 0406 02/11/21 0255 02/12/21 0240  WBC 13.5* 10.5 8.5 7.6 14.0* 11.6*  NEUTROABS 12.5* 9.8*  --   --   --   --   HGB 11.2* 10.5* 9.9* 9.8* 11.0* 11.3*  HCT 34.8* 32.3* 30.9* 30.7* 33.2* 35.4*  MCV 95.1 92.8 94.8 93.3 91.0 93.2  PLT 158 139* 162 179 235 277    Basic Metabolic Panel: Recent Labs  Lab 02/08/21 0017 02/09/21 0247 02/10/21 0406 02/11/21 0255 02/12/21 0240  NA 134* 135 137 137 139  K 3.4* 3.4* 4.1 3.7 3.8  CL 107 107 106 106 106  CO2 18* 23 22 21* 20*  GLUCOSE 138* 120* 174* 136* 159*  BUN 28*  CREATININE 1.54* 1.41* 1.38* 1.39* 1.47*  CALCIUM 7.9* 8.0* 8.2* 8.7* 8.8*  MG 1.6*  1.5* 2.2 2.0 2.1 2.2  PHOS 1.9* 2.9 2.3*  --  3.4    GFR: Estimated Creatinine  Clearance: 48.3 mL/min (A) (by C-G formula based on SCr of 1.47 mg/dL (H)).  Liver Function Tests: Recent Labs  Lab 02/08/21 1620 02/08/21 0017  AST 17 13*  ALT 14 14  ALKPHOS 90 51  BILITOT 0.7 0.9  PROT 5.5* 5.3*  ALBUMIN 3.0* 2.8*    CBG: No results for input(s): GLUCAP in the last 168 hours.   Recent Results (from the past 240 hour(s))  Blood Culture (routine x 2)     Status: None   Collection Time: 2021/02/08  4:20 PM   Specimen: BLOOD LEFT WRIST  Result Value Ref Range Status   Specimen Description BLOOD LEFT WRIST  Final   Special Requests   Final    BOTTLES DRAWN AEROBIC AND ANAEROBIC Blood Culture adequate volume  Culture   Final    NO GROWTH 5 DAYS Performed at [facility], [address] [place], [place] [address]    Report Status 02/12/2021 FINAL  Final  Blood Culture (routine x 2)     Status: None   Collection Time: 02/07/21  4:27 PM   Specimen: BLOOD LEFT FOREARM  Result Value Ref Range Status   Specimen Description BLOOD LEFT FOREARM  Final   Special Requests   Final    BOTTLES DRAWN AEROBIC AND ANAEROBIC Blood Culture adequate volume   Culture   Final    NO GROWTH 5 DAYS Performed at [facility], [address] [place], [place] [address]    Report Status 02/12/2021 FINAL  Final  Resp Panel by RT-PCR (Flu A&B, Covid) Nasopharyngeal Swab     Status: None   Collection Time: 02/07/21  4:42 PM   Specimen: Nasopharyngeal Swab; Nasopharyngeal(NP) swabs in vial transport medium  Result Value Ref Range Status   SARS Coronavirus 2 by RT PCR NEGATIVE NEGATIVE Final    Comment: (NOTE) SARS-CoV-2 target nucleic acids are NOT DETECTED.  The SARS-CoV-2 RNA is generally detectable in upper respiratory specimens during the acute phase of infection. The lowest concentration of SARS-CoV-2 viral copies this assay can detect is 138 copies/mL. A negative result does not preclude SARS-Cov-2 infection and should not be used as the sole basis for  treatment or other patient management decisions. A negative result may occur with  improper specimen collection/handling, submission of specimen other than nasopharyngeal swab, presence of viral mutation(s) within the areas targeted by this assay, and inadequate number of viral copies(<138 copies/mL). A negative result must be combined with clinical observations, patient history, and epidemiological information. The expected result is Negative.  Fact Sheet for Patients:  BloggerCourse.com  Fact Sheet for Healthcare Providers:  SeriousBroker.it  This test is no t yet approved or cleared by the [place] FDA and  has been authorized for detection and/or diagnosis of SARS-CoV-2 by FDA under an Emergency Use Authorization (EUA). This EUA will remain  in effect (meaning this test can be used) for the duration of the COVID-19 declaration under Section 564(b)(1) of the Act, 21 U.S.C.section 360bbb-3(b)(1), unless the authorization is terminated  or revoked sooner.       Influenza A by PCR NEGATIVE NEGATIVE Final   Influenza B by PCR NEGATIVE NEGATIVE Final    Comment: (NOTE) The Xpert Xpress SARS-CoV-2/FLU/RSV plus assay is intended as an aid in the diagnosis of influenza from Nasopharyngeal swab specimens and should not be used as a sole basis for treatment. Nasal washings and aspirates are unacceptable for Xpert Xpress SARS-CoV-2/FLU/RSV testing.  Fact Sheet for Patients: BloggerCourse.com  Fact Sheet for Healthcare Providers: SeriousBroker.it  This test is not yet approved or cleared by the [place] FDA and has been authorized for detection and/or diagnosis of SARS-CoV-2 by FDA under an Emergency Use Authorization (EUA). This EUA will remain in effect (meaning this test can be used) for the duration of the COVID-19 declaration under Section 564(b)(1) of the Act, 21  U.S.C. section 360bbb-3(b)(1), unless the authorization is terminated or revoked.  Performed at [facility], [address] [place], [place] [address]   Urine culture     Status: None   Collection Time: 02/07/21  6:20 PM   Specimen: Urine, Random  Result Value Ref Range Status   Specimen Description URINE, RANDOM  Final   Special Requests NONE  Final  Culture   Final    NO GROWTH Performed at [facility], [address] [place], [place] [address]    Report Status 02/09/2021 FINAL  Final  MRSA PCR Screening     Status: None   Collection Time: 02/07/21  9:40 PM   Specimen: Nasal Mucosa; Nasopharyngeal  Result Value Ref Range Status   MRSA by PCR NEGATIVE NEGATIVE Final    Comment:        The GeneXpert MRSA Assay (FDA approved for NASAL specimens only), is one component of a comprehensive MRSA colonization surveillance program. It is not intended to diagnose MRSA infection nor to guide or monitor treatment for MRSA infections. Performed at [facility], [address] [place], [place] [address]   Gastrointestinal Panel by PCR , Stool     Status: None   Collection Time: 02/07/21 11:29 PM   Specimen: Nasal Mucosa; Stool  Result Value Ref Range Status   Campylobacter species NOT DETECTED NOT DETECTED Final   Plesimonas shigelloides NOT DETECTED NOT DETECTED Final   Salmonella species NOT DETECTED NOT DETECTED Final   Yersinia enterocolitica NOT DETECTED NOT DETECTED Final   Vibrio species NOT DETECTED NOT DETECTED Final   Vibrio cholerae NOT DETECTED NOT DETECTED Final   Enteroaggregative E coli (EAEC) NOT DETECTED NOT DETECTED Final   Enteropathogenic E coli (EPEC) NOT DETECTED NOT DETECTED Final   Enterotoxigenic E coli (ETEC) NOT DETECTED NOT DETECTED Final   Shiga like toxin producing E coli (STEC) NOT DETECTED NOT DETECTED Final   Shigella/Enteroinvasive E coli (EIEC) NOT DETECTED NOT DETECTED Final   Cryptosporidium NOT DETECTED NOT DETECTED  Final   Cyclospora cayetanensis NOT DETECTED NOT DETECTED Final   Entamoeba histolytica NOT DETECTED NOT DETECTED Final   Giardia lamblia NOT DETECTED NOT DETECTED Final   Adenovirus F40/41 NOT DETECTED NOT DETECTED Final   Astrovirus NOT DETECTED NOT DETECTED Final   Norovirus GI/GII NOT DETECTED NOT DETECTED Final   Rotavirus A NOT DETECTED NOT DETECTED Final   Sapovirus (I, II, IV, and V) NOT DETECTED NOT DETECTED Final    Comment: Performed at [facility], [address] [place], [place] [address]  C Difficile Quick Screen w PCR reflex     Status: None   Collection Time: 02/09/21 11:33 AM   Specimen: STOOL  Result Value Ref Range Status   C Diff antigen NEGATIVE NEGATIVE Final   C Diff toxin NEGATIVE NEGATIVE Final   C Diff interpretation No C. difficile detected.  Final    Comment: Performed at [facility], [address] [place], [place] [address]  Respiratory (~20 pathogens) panel by PCR     Status: None   Collection Time: 02/09/21  5:18 PM   Specimen: Nasopharyngeal Swab; Respiratory  Result Value Ref Range Status   Adenovirus NOT DETECTED NOT DETECTED Final   Coronavirus 229E NOT DETECTED NOT DETECTED Final    Comment: (NOTE) The Coronavirus on the Respiratory Panel, DOES NOT test for the novel  Coronavirus (2019 nCoV)    Coronavirus HKU1 NOT DETECTED NOT DETECTED Final   Coronavirus NL63 NOT DETECTED NOT DETECTED Final   Coronavirus OC43 NOT DETECTED NOT DETECTED Final   Metapneumovirus NOT DETECTED NOT DETECTED Final   Rhinovirus / Enterovirus NOT DETECTED NOT DETECTED Final   Influenza A NOT DETECTED NOT DETECTED Final   Influenza B NOT DETECTED NOT DETECTED Final   Parainfluenza Virus 1 NOT DETECTED NOT DETECTED Final   Parainfluenza Virus 2  NOT DETECTED NOT DETECTED Final   Parainfluenza Virus 3 NOT DETECTED NOT DETECTED Final   Parainfluenza Virus 4 NOT DETECTED NOT DETECTED Final   Respiratory Syncytial Virus NOT DETECTED NOT DETECTED Final    Bordetella pertussis NOT DETECTED NOT DETECTED Final   Bordetella Parapertussis NOT DETECTED NOT DETECTED Final   Chlamydophila pneumoniae NOT DETECTED NOT DETECTED Final   Mycoplasma pneumoniae NOT DETECTED NOT DETECTED Final    Comment: Performed at [facility], [address] [place], [place] [address]     Radiology Studies: No results found.   Scheduled Meds: . budesonide (PULMICORT) nebulizer solution  0.5 mg Nebulization BID  . enoxaparin (LOVENOX) injection  40 mg Subcutaneous Q24H  . finasteride  5 mg Oral Daily  . furosemide  40 mg Intravenous Once  . gabapentin  300 mg Oral BID  . ipratropium-albuterol  3 mL Nebulization BID  . lamoTRIgine  400 mg Oral BID  . lisinopril  2.5 mg Oral Daily  . methylPREDNISolone (SOLU-MEDROL) injection  40 mg Intravenous Daily  . metoprolol tartrate  37.5 mg Oral BID  . mycophenolate  500 mg Oral BID  . saccharomyces boulardii  250 mg Oral BID  . simvastatin  40 mg Oral Daily   Continuous Infusions: . ceFEPime (MAXIPIME) IV 2 g (02/13/21 1048)     LOS: 6 days    Joycelyn DasLaxman Hubbert Landrigan, MD [facility] 02/13/2021, 1:05 PM

## 2021-02-13 NOTE — Progress Notes (Signed)
C/O "aura" and states he feels like he is getting ready to have a "seizure". Decreased alertness noted, however, stilll able to answer simple 'Yes' or 'No' questions. Assisted from sitting position on edge of bed to lying down in bed. Muscle twitching noted bilateral upper and lower extremities and eyelids.  Side rails raised x 4. Ativan 1 mg IV given on prn schedule. O2 placed at 3 L [place]. Dr Tyson Babinski notified of seizure activity.

## 2021-02-14 DIAGNOSIS — R6520 Severe sepsis without septic shock: Secondary | ICD-10-CM

## 2021-02-14 DIAGNOSIS — A419 Sepsis, unspecified organism: Principal | ICD-10-CM

## 2021-02-14 LAB — BASIC METABOLIC PANEL
Anion gap: 8 (ref 5–15)
BUN: 23 mg/dL (ref 8–23)
CO2: 26 mmol/L (ref 22–32)
Calcium: 8.1 mg/dL — ABNORMAL LOW (ref 8.9–10.3)
Chloride: 104 mmol/L (ref 98–111)
Creatinine, Ser: 1.57 mg/dL — ABNORMAL HIGH (ref 0.61–1.24)
GFR, Estimated: 47 mL/min — ABNORMAL LOW (ref >60)
Glucose, Bld: 155 mg/dL — ABNORMAL HIGH (ref 70–99)
Potassium: 2.9 mmol/L — ABNORMAL LOW (ref 3.5–5.1)
Sodium: 138 mmol/L (ref 135–145)

## 2021-02-14 LAB — CBC
HCT: 31.2 % — ABNORMAL LOW (ref 39.0–52.0)
Hemoglobin: 9.9 g/dL — ABNORMAL LOW (ref 13.0–17.0)
MCH: 29.5 pg (ref 26.0–34.0)
MCHC: 31.7 g/dL (ref 30.0–36.0)
MCV: 92.9 fL (ref 80.0–100.0)
Platelets: 312 10*3/uL (ref 150–400)
RBC: 3.36 MIL/uL — ABNORMAL LOW (ref 4.22–5.81)
RDW: 13.4 % (ref 11.5–15.5)
WBC: 9.5 10*3/uL (ref 4.0–10.5)
nRBC: 0 % (ref 0.0–0.2)

## 2021-02-14 LAB — GLUCOSE, CAPILLARY: Glucose-Capillary: 94 mg/dL (ref 70–99)

## 2021-02-14 LAB — MAGNESIUM: Magnesium: 2 mg/dL (ref 1.7–2.4)

## 2021-02-14 MED ORDER — POTASSIUM CHLORIDE CRYS ER 20 MEQ PO TBCR
40.0000 meq | EXTENDED_RELEASE_TABLET | Freq: Once | ORAL | Status: AC
  Administered 2021-02-14: 40 meq via ORAL
  Filled 2021-02-14: qty 2

## 2021-02-14 MED ORDER — LORAZEPAM 2 MG/ML IJ SOLN: 1.0000 mg | INTRAMUSCULAR | Status: DC | PRN

## 2021-02-14 MED ORDER — LEVETIRACETAM 500 MG PO TABS: 500.0000 mg | ORAL_TABLET | Freq: Every day | ORAL | Status: DC

## 2021-02-14 MED ORDER — LEVETIRACETAM 500 MG PO TABS: 500.0000 mg | ORAL_TABLET | Freq: Two times a day (BID) | ORAL | Status: DC

## 2021-02-14 MED ORDER — LEVETIRACETAM 500 MG PO TABS
500.0000 mg | ORAL_TABLET | Freq: Two times a day (BID) | ORAL | Status: DC
  Administered 2021-02-14: 500 mg via ORAL
  Filled 2021-02-14: qty 1

## 2021-02-14 MED ORDER — PREDNISONE 20 MG PO TABS
40.0000 mg | ORAL_TABLET | Freq: Every day | ORAL | Status: DC
  Administered 2021-02-15: 40 mg via ORAL
  Filled 2021-02-14: qty 2

## 2021-02-14 MED ORDER — POTASSIUM CHLORIDE 10 MEQ/100ML IV SOLN
10.0000 meq | INTRAVENOUS | Status: AC
  Administered 2021-02-14 (×4): 10 meq via INTRAVENOUS
  Filled 2021-02-14 (×4): qty 100

## 2021-02-14 MED ORDER — LEVETIRACETAM IN NACL 1500 MG/100ML IV SOLN
1500.0000 mg | Freq: Once | INTRAVENOUS | Status: AC
  Administered 2021-02-14: 1500 mg via INTRAVENOUS
  Filled 2021-02-14: qty 100

## 2021-02-14 MED ORDER — LEVETIRACETAM 500 MG PO TABS
500.0000 mg | ORAL_TABLET | Freq: Two times a day (BID) | ORAL | Status: DC
  Administered 2021-02-14 – 2021-02-15 (×2): 500 mg via ORAL
  Filled 2021-02-14 (×2): qty 1

## 2021-02-14 NOTE — Progress Notes (Signed)
Patient weaned of oxygen and tolerated room air for several hours. C/o SOB after bathing and ambulating in room, oxygen sats decreased to 86%. Placed back on oxygen  2L [place] with sats increasing to 96%.  Will continue to monitor oxygen.

## 2021-02-14 NOTE — Progress Notes (Signed)
PT Cancellation Note  Patient Details Name: Ryan Gentry MRN: [id number] DOB: 01/27/1951   Cancelled Treatment:    Reason Eval/Treat Not Completed: Medical issues which prohibited therapy Holding per RN due to seizures this AM. Still postical. Will follow up.   Ryan Gentry 02/14/2021, 11:37 AM [place], PT, DPT Acute Rehabilitation Services Pager [phone] Office [phone]

## 2021-02-14 NOTE — Progress Notes (Signed)
Notified Dr. Tyson Babinski patient's K 2.9 and overnight seizure.

## 2021-02-14 NOTE — Progress Notes (Signed)
PROGRESS NOTE    Ryan Gentry  OXB:[id number] DOB: 12-08-1950 DOA: 02/07/2021  PCP: Pcp, No   Brief Narrative:   Ryan Gentry is a 70 y.o. male with medical history significant for ANCA associated vasculitis, seizure disorder, generalized anxiety disorder, urinary incontinence, hyperlipidemia, presented to the  with hospital with shortness of breath, subjective fevers and chills with generalized myalgia and cough.  He also had few episodes of nausea and vomiting.  Of note, patient was traveling from [place] in route to [place].  He is on immunosuppressants with CellCept.  Patient presented to the ED and was noted to have oxygen saturation in the 80s on room air.  Patient was  given nasal cannula oxygen and was admitted to hospital.  Patient had creatinine of 1.6-1.7 in 2016 and around 1.6 in June 2021. Patient was then admitted to hospital for severe sepsis secondary to pneumonia with acute hypoxic respiratory failure.   Assessment & Plan:  Severe sepsis secondary to pneumonia Present on admission.  Negative MRSA PCR, so vancomycin was discontinued.  Patient has completed cefepime and Zithromax.  Continue Pulmicort, duo nebs, oxygen supplementation, flutter valve and incentive spirometer . Blood cultures negative so far.  Urine culture no growth.  Procalcitonin elevated.  Temperature max of 98.7 Fahrenheit.  Leukocytosis has improved.  Acute respiratory failure with hypoxia  Likely secondary to pneumonia.  Patient initially required nonrebreather mask followed by nasal cannula oxygen. Continue with IV antibiotics, bronchodilators.   We will change the Solu-Medrol to prednisone.  Continue to wean oxygen.  Continue 2 L of oxygen by nasal cannula.  Received IV Lasix x1 yesterday.  Hypokalemia.  Potassium of 2.9 today.  Placed through IV and oral.  Check levels in a.m.  Seizures with breakthrough seizure No seizures on admission.  Continue Lamictal and Neurontin.  Seizure precautions.  Lamictal  level at 13.5.  Neurology was consulted and patient has been prescribed Keppra at this time.  Patient follows up with his neurologist back in [place].  Nausea & vomiting, diarrhea Resolved.  Latest sodium of 138.  Hyponatremia Resolved.  Continue to hold HCTZ.  Elevated troponin Without any chest pain or EKG changes.  Likely nonspecific elevation.  GAD (generalized anxiety disorder) -continue ativan PRN  Hypophosphatemia, hypomagnesemia Replenished and improved.   BPH (benign prostatic hyperplasia) -continue proscar and flomax  ANCA associated vasculitis  Continue CellCept and follow-up with rheumatology as outpatient.     Essential HTN Continue lisinopril and metoprolol. Blood pressure is controlled at this time.  Debility, weakness.  Patient was seen by physical therapy who recommend home PT on discharge.    DVT prophylaxis:   Lovenox subcu  Code Status:  Full code.    Family Communication: I again updated the patient's spouse Ms Lenise Herald on the phone and updated her about the clinical condition of the patient.  Disposition:  Status is: Inpatient due to IV antibiotics, acute hypoxic respiratory failure,  Dispo: The patient is from: home               Anticipated d/c is to: Likely home with home health, Will likely need oxygen/home health on discharge              Patient currently not medically stable for discharge;     Difficult to place patient: no    Consultants:   None  Procedures:  None.  Antimicrobials:  Vancomycin 02/07/21>>02/08/21 Cefepime 02/07/21> completed Zithromax 02/07/21> completed  Subjective:  Today, patient was seen and examined at bedside.  Patient was seen in during seizure-like episode.  Neurology at bedside.  Patient required oxygen supplementation this morning and had seizure episode yesterday as well.  Denies any chest pain, fever, chills or rigor.   Objective: Vitals:   02/14/21 1101 02/14/21 1131 02/14/21 1200 02/14/21 1300  BP: 125/78  112/67 (!) 106/94 121/73  Pulse: 75 68 68 73  Resp:   20   Temp:   98.6 F (37 C)   TempSrc:   Oral   SpO2: 96% 96% 94% 98%  Weight:      Height:        Intake/Output Summary (Last 24 hours) at 02/14/2021 1324 Last data filed at 02/14/2021 1234 Gross per 24 hour  Intake 1149.56 ml  Output 2401 ml  Net -1251.44 ml   Filed Weights   02/11/21 0510 02/12/21 0425 02/14/21 0451  Weight: 80 kg 80.9 kg 78.7 kg    Physical examination: General:  Average built, not in obvious distress, on nasal cannula oxygen HENT:   No scleral pallor or icterus noted. Oral mucosa is moist.  Chest:    Decreased breath sounds bilaterally.  Coarse breath sounds noted.  CVS: S1 &S2 heard. No murmur.  Regular rate and rhythm. Abdomen: Soft, nontender, nondistended.  Bowel sounds are heard.   Extremities: No cyanosis, clubbing but trace pedal edema noted.  Peripheral pulses are palpable. Psych: Alert, awake and oriented, anxious CNS:  No cranial nerve deficits.  Moves all extremities. Skin: Warm and dry.  No rashes noted.   Data Reviewed: I have personally reviewed the following labs and imaging studies.   CBC: Recent Labs  Lab 02/07/21 1620 02/08/21 0017 02/09/21 0247 02/10/21 0406 02/11/21 0255 02/12/21 0240 02/14/21 0158  WBC 13.5* 10.5 8.5 7.6 14.0* 11.6* 9.5  NEUTROABS 12.5* 9.8*  --   --   --   --   --   HGB 11.2* 10.5* 9.9* 9.8* 11.0* 11.3* 9.9*  HCT 34.8* 32.3* 30.9* 30.7* 33.2* 35.4* 31.2*  MCV 95.1 92.8 94.8 93.3 91.0 93.2 92.9  PLT 158 139* 162 179 235 277 312    Basic Metabolic Panel: Recent Labs  Lab 02/08/21 0017 02/09/21 0247 02/10/21 0406 02/11/21 0255 02/12/21 0240 02/14/21 0158  NA 134* 135 137 137 139 138  K 3.4* 3.4* 4.1 3.7 3.8 2.9*  CL 107 107 106 106 106 104  CO2 18* 23 22 21* 20* 26  GLUCOSE 138* 120* 174* 136* 159* 155*  BUN 23 19 19 23  28* 23  CREATININE 1.54* 1.41* 1.38* 1.39* 1.47* 1.57*  CALCIUM 7.9* 8.0* 8.2* 8.7* 8.8* 8.1*  MG 1.6*  1.5* 2.2 2.0  2.1 2.2 2.0  PHOS 1.9* 2.9 2.3*  --  3.4  --     GFR: Estimated Creatinine Clearance: 45.2 mL/min (A) (by C-G formula based on SCr of 1.57 mg/dL (H)).  Liver Function Tests: Recent Labs  Lab 02/07/21 1620 02/08/21 0017  AST 17 13*  ALT 14 14  ALKPHOS 90 51  BILITOT 0.7 0.9  PROT 5.5* 5.3*  ALBUMIN 3.0* 2.8*    CBG: Recent Labs  Lab 02/14/21 0555  GLUCAP 94     Recent Results (from the past 240 hour(s))  Blood Culture (routine x 2)     Status: None   Collection Time: 02/07/21  4:20 PM   Specimen: BLOOD LEFT WRIST  Result Value Ref Range Status   Specimen Description BLOOD LEFT WRIST  Final   Special Requests   Final    BOTTLES DRAWN  AEROBIC AND ANAEROBIC Blood Culture adequate volume   Culture   Final    NO GROWTH 5 DAYS Performed at [facility], [address], [place], [place] [address]    Report Status 02/12/2021 FINAL  Final  Blood Culture (routine x 2)     Status: None   Collection Time: 02/07/21  4:27 PM   Specimen: BLOOD LEFT FOREARM  Result Value Ref Range Status   Specimen Description BLOOD LEFT FOREARM  Final   Special Requests   Final    BOTTLES DRAWN AEROBIC AND ANAEROBIC Blood Culture adequate volume   Culture   Final    NO GROWTH 5 DAYS Performed at [facility], [address] [place], [place] [address]    Report Status 02/12/2021 FINAL  Final  Resp Panel by RT-PCR (Flu A&B, Covid) Nasopharyngeal Swab     Status: None   Collection Time: 02/07/21  4:42 PM   Specimen: Nasopharyngeal Swab; Nasopharyngeal(NP) swabs in vial transport medium  Result Value Ref Range Status   SARS Coronavirus 2 by RT PCR NEGATIVE NEGATIVE Final    Comment: (NOTE) SARS-CoV-2 target nucleic acids are NOT DETECTED.  The SARS-CoV-2 RNA is generally detectable in upper respiratory specimens during the acute phase of infection. The lowest concentration of SARS-CoV-2 viral copies this assay can detect is 138 copies/mL. A negative result does not preclude  SARS-Cov-2 infection and should not be used as the sole basis for treatment or other patient management decisions. A negative result may occur with  improper specimen collection/handling, submission of specimen other than nasopharyngeal swab, presence of viral mutation(s) within the areas targeted by this assay, and inadequate number of viral copies(<138 copies/mL). A negative result must be combined with clinical observations, patient history, and epidemiological information. The expected result is Negative.  Fact Sheet for Patients:  BloggerCourse.com  Fact Sheet for Healthcare Providers:  SeriousBroker.it  This test is no t yet approved or cleared by the [place] FDA and  has been authorized for detection and/or diagnosis of SARS-CoV-2 by FDA under an Emergency Use Authorization (EUA). This EUA will remain  in effect (meaning this test can be used) for the duration of the COVID-19 declaration under Section 564(b)(1) of the Act, 21 U.S.C.section 360bbb-3(b)(1), unless the authorization is terminated  or revoked sooner.       Influenza A by PCR NEGATIVE NEGATIVE Final   Influenza B by PCR NEGATIVE NEGATIVE Final    Comment: (NOTE) The Xpert Xpress SARS-CoV-2/FLU/RSV plus assay is intended as an aid in the diagnosis of influenza from Nasopharyngeal swab specimens and should not be used as a sole basis for treatment. Nasal washings and aspirates are unacceptable for Xpert Xpress SARS-CoV-2/FLU/RSV testing.  Fact Sheet for Patients: BloggerCourse.com  Fact Sheet for Healthcare Providers: SeriousBroker.it  This test is not yet approved or cleared by the [place] FDA and has been authorized for detection and/or diagnosis of SARS-CoV-2 by FDA under an Emergency Use Authorization (EUA). This EUA will remain in effect (meaning this test can be used) for the duration of  the COVID-19 declaration under Section 564(b)(1) of the Act, 21 U.S.C. section 360bbb-3(b)(1), unless the authorization is terminated or revoked.  Performed at [facility], [address] [place], [place] [address]   Urine culture     Status: None   Collection Time: 02/07/21  6:20 PM   Specimen: Urine, Random  Result Value Ref Range Status   Specimen Description URINE, RANDOM  Final  Special Requests NONE  Final   Culture   Final    NO GROWTH Performed at [facility], [address], [place], [place] [address]    Report Status 02/09/2021 FINAL  Final  MRSA PCR Screening     Status: None   Collection Time: 02/07/21  9:40 PM   Specimen: Nasal Mucosa; Nasopharyngeal  Result Value Ref Range Status   MRSA by PCR NEGATIVE NEGATIVE Final    Comment:        The GeneXpert MRSA Assay (FDA approved for NASAL specimens only), is one component of a comprehensive MRSA colonization surveillance program. It is not intended to diagnose MRSA infection nor to guide or monitor treatment for MRSA infections. Performed at [facility], [address] [place], [place] [address]   Gastrointestinal Panel by PCR , Stool     Status: None   Collection Time: 02/07/21 11:29 PM   Specimen: Nasal Mucosa; Stool  Result Value Ref Range Status   Campylobacter species NOT DETECTED NOT DETECTED Final   Plesimonas shigelloides NOT DETECTED NOT DETECTED Final   Salmonella species NOT DETECTED NOT DETECTED Final   Yersinia enterocolitica NOT DETECTED NOT DETECTED Final   Vibrio species NOT DETECTED NOT DETECTED Final   Vibrio cholerae NOT DETECTED NOT DETECTED Final   Enteroaggregative E coli (EAEC) NOT DETECTED NOT DETECTED Final   Enteropathogenic E coli (EPEC) NOT DETECTED NOT DETECTED Final   Enterotoxigenic E coli (ETEC) NOT DETECTED NOT DETECTED Final   Shiga like toxin producing E coli (STEC) NOT DETECTED NOT DETECTED Final   Shigella/Enteroinvasive E coli (EIEC) NOT DETECTED  NOT DETECTED Final   Cryptosporidium NOT DETECTED NOT DETECTED Final   Cyclospora cayetanensis NOT DETECTED NOT DETECTED Final   Entamoeba histolytica NOT DETECTED NOT DETECTED Final   Giardia lamblia NOT DETECTED NOT DETECTED Final   Adenovirus F40/41 NOT DETECTED NOT DETECTED Final   Astrovirus NOT DETECTED NOT DETECTED Final   Norovirus GI/GII NOT DETECTED NOT DETECTED Final   Rotavirus A NOT DETECTED NOT DETECTED Final   Sapovirus (I, II, IV, and V) NOT DETECTED NOT DETECTED Final    Comment: Performed at [facility], [address] [place], [place] [address]  C Difficile Quick Screen w PCR reflex     Status: None   Collection Time: 02/09/21 11:33 AM   Specimen: STOOL  Result Value Ref Range Status   C Diff antigen NEGATIVE NEGATIVE Final   C Diff toxin NEGATIVE NEGATIVE Final   C Diff interpretation No C. difficile detected.  Final    Comment: Performed at [facility], [address] [place], [place] [address]  Respiratory (~20 pathogens) panel by PCR     Status: None   Collection Time: 02/09/21  5:18 PM   Specimen: Nasopharyngeal Swab; Respiratory  Result Value Ref Range Status   Adenovirus NOT DETECTED NOT DETECTED Final   Coronavirus 229E NOT DETECTED NOT DETECTED Final    Comment: (NOTE) The Coronavirus on the Respiratory Panel, DOES NOT test for the novel  Coronavirus (2019 nCoV)    Coronavirus HKU1 NOT DETECTED NOT DETECTED Final   Coronavirus NL63 NOT DETECTED NOT DETECTED Final   Coronavirus OC43 NOT DETECTED NOT DETECTED Final   Metapneumovirus NOT DETECTED NOT DETECTED Final   Rhinovirus / Enterovirus NOT DETECTED NOT DETECTED Final   Influenza A NOT DETECTED NOT DETECTED Final   Influenza B NOT DETECTED NOT DETECTED Final   Parainfluenza Virus 1 NOT DETECTED NOT  DETECTED Final   Parainfluenza Virus 2 NOT DETECTED NOT DETECTED Final   Parainfluenza Virus 3 NOT DETECTED NOT DETECTED Final   Parainfluenza Virus 4 NOT DETECTED NOT DETECTED Final    Respiratory Syncytial Virus NOT DETECTED NOT DETECTED Final   Bordetella pertussis NOT DETECTED NOT DETECTED Final   Bordetella Parapertussis NOT DETECTED NOT DETECTED Final   Chlamydophila pneumoniae NOT DETECTED NOT DETECTED Final   Mycoplasma pneumoniae NOT DETECTED NOT DETECTED Final    Comment: Performed at [facility], [address] [place], [place] [address]     Radiology Studies: No results found.   Scheduled Meds: . budesonide (PULMICORT) nebulizer solution  0.5 mg Nebulization BID  . enoxaparin (LOVENOX) injection  40 mg Subcutaneous Q24H  . finasteride  5 mg Oral Daily  . gabapentin  300 mg Oral BID  . ipratropium-albuterol  3 mL Nebulization BID  . lamoTRIgine  400 mg Oral BID  . [START ON 02/17/2021] levETIRAcetam  500 mg Oral Daily  . levETIRAcetam  500 mg Oral BID  . lisinopril  2.5 mg Oral Daily  . methylPREDNISolone (SOLU-MEDROL) injection  40 mg Intravenous Daily  . metoprolol tartrate  37.5 mg Oral BID  . mycophenolate  500 mg Oral BID  . saccharomyces boulardii  250 mg Oral BID  . simvastatin  40 mg Oral Daily   Continuous Infusions:    LOS: 7 days    Joycelyn Das, MD [facility] 02/14/2021, 1:24 PM

## 2021-02-14 NOTE — Progress Notes (Signed)
Physical Therapy Treatment Patient Details Name: Ryan Gentry MRN: [id number] DOB: Jun 30, 1951 Today's Date: 02/14/2021    History of Present Illness 70 y.o. male presents to [facility] ED on 02/07/2021 with complaints of SOB. Pt found to be septic 2/2 RLL PNA. s/p seizures 5/8 and 5/9. Pt also reports recent history of vomiting and diarrhea. PMH includes ANCA associated vasculitis, seizure, generalized anxitey disorder, urinary incontinence, HLD.    PT Comments    Patient progressing well s/p seizures this AM and yesterday. Tolerated transfers and gait training with Min guard assist and use of RW for support. Very slow, guarded gait noted with cues to increase speed and for forward gaze. 1/4 DOE. Sp02 remained >90% on RA with activity.  2 standing rest breaks needed. Good demonstration of pursed lip breathing. Continues to be a fall risk due to slow gait speed. Encouraged walking with nursing later in day. Pt agreeable and motivated. Will follow.   Follow Up Recommendations  Home health PT;Supervision - Intermittent     Equipment Recommendations  Wheelchair (measurements PT);Wheelchair cushion (measurements PT);Rolling walker with 5" wheels    Recommendations for Other Services       Precautions / Restrictions Precautions Precautions: Fall Restrictions Weight Bearing Restrictions: No    Mobility  Bed Mobility Overal bed mobility: Needs Assistance Bed Mobility: Supine to Sit;Sit to Supine     Supine to sit: Supervision;HOB elevated Sit to supine: Supervision;HOB elevated   General bed mobility comments: No assist needed.    Transfers Overall transfer level: Needs assistance Equipment used: Rolling walker (2 wheeled) Transfers: Sit to/from Stand Sit to Stand: Min guard         General transfer comment: Min guard for safety. Cues for hand placement/technique.  Ambulation/Gait Ambulation/Gait assistance: Min guard Gait Distance (Feet): 100 Feet Assistive device: Rolling  walker (2 wheeled) Gait Pattern/deviations: Step-through pattern;Decreased stride length;Decreased step length - right;Decreased step length - left Gait velocity: very slow Gait velocity interpretation: <1.31 ft/sec, indicative of household ambulator General Gait Details: Very slow, guarded gait with downward gaze; cues to increase speed for forward gaze. 2 standing rest breaks. Mildly unsteady during turn. Sp02 remained >90% on RA throughout.   Stairs             Wheelchair Mobility    Modified Rankin (Stroke Patients Only)       Balance Overall balance assessment: Needs assistance Sitting-balance support: Feet supported;No upper extremity supported Sitting balance-Leahy Scale: Good     Standing balance support: During functional activity Standing balance-Leahy Scale: Poor Standing balance comment: Requires UE support                            Cognition Arousal/Alertness: Awake/alert Behavior During Therapy: WFL for tasks assessed/performed Overall Cognitive Status: Within Functional Limits for tasks assessed                                        Exercises      General Comments General comments (skin integrity, edema, etc.): Wife present during session. Sp02 remained >90% on RA with activity.      Pertinent Vitals/Pain Pain Assessment: Faces Faces Pain Scale: Hurts little more Pain Location: LUE due to potassium Pain Descriptors / Indicators: Burning Pain Intervention(s): Monitored during session    Home Living  Prior Function            PT Goals (current goals can now be found in the care plan section) Progress towards PT goals: Progressing toward goals    Frequency    Min 3X/week      PT Plan Current plan remains appropriate    Co-evaluation              AM-PAC PT "6 Clicks" Mobility   Outcome Measure  Help needed turning from your back to your side while in a flat bed  without using bedrails?: None Help needed moving from lying on your back to sitting on the side of a flat bed without using bedrails?: None Help needed moving to and from a bed to a chair (including a wheelchair)?: A Little Help needed standing up from a chair using your arms (e.g., wheelchair or bedside chair)?: A Little Help needed to walk in hospital room?: A Little Help needed climbing 3-5 steps with a railing? : A Little 6 Click Score: 20    End of Session Equipment Utilized During Treatment: Gait belt;Oxygen Activity Tolerance: Patient tolerated treatment well Patient left: in bed;with call bell/phone within reach;with bed alarm set Nurse Communication: Mobility status;Other (comment) (walk later and no 02 needed) PT Visit Diagnosis: Unsteadiness on feet (R26.81);Other abnormalities of gait and mobility (R26.89);Muscle weakness (generalized) (M62.81)     Time: 8295-6213 PT Time Calculation (min) (ACUTE ONLY): 37 min  Charges:  $Gait Training: 23-37 mins                     Vale Haven, PT, DPT Acute Rehabilitation Services Pager [phone] Office [phone]       Blake Divine A Lanier Ensign 02/14/2021, 4:06 PM

## 2021-02-14 NOTE — Progress Notes (Signed)
Pt awoke from a deep sleep and called for the RN to come in to explain where he was- I began to explain where he is and what he was doing here- during the conversation he began to have a seizure- Ativan given and Rapid Response was called to assess patient-  Dr. Antionette Char was notified with this information.

## 2021-02-14 NOTE — Consult Note (Signed)
Neurology Consultation  Reason for Consult: seizure Referring Physician: Dr. Birdena Jubilee  CC: seizure  History is obtained from: chart, wife, patient  HPI: Ryan Gentry is a 70 y.o. 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 He has a history of seizures on Lamictal and Gabapentin at home. He was started on Cefepime and Vancomycin. The Cefepime ended on 02/13/21. He has been given his home AEDs since admission and no seizure activity was noted up until 02/13/21. This a.m., patient pushed the call bell because he could not figure out where he was. Upon RN arrival patient was confused. Patient then had a seizure and Ativan was given. Later, around 0830 hours, patient was found on side of bed not responding to questions and chewing motions noted to mouth.   In review of chart, patient had some seizure like activity described by RN as muscle twitching to BU/LEs and eyelids on 02/13/21 as well. Ativan was given then.   Lamictal level was therapeutic on 02/09/21. Since patient lives in [place], it is unknown when he was diagnosed with seizure disorder, but per wife, has not had a seizure in months.   Neurology was asked to consult for seizure activity.   ROS: A robust ROS was performed and is negative except as noted in the HPI.   Past Medical History:  Diagnosis Date  . ANCA-associated vasculitis (HCC)    with MPA  . Autoimmune disorder (HCC)   . Seizures (HCC)     History reviewed. No pertinent family history.   Social History:   reports that he has been smoking cigarettes. He has a 25.00 pack-year smoking history. He has never used smokeless tobacco. He reports that he does not drink alcohol and does not use drugs.  Medications  Current Facility-Administered Medications:  .  acetaminophen (TYLENOL) tablet 650 mg, 650 mg,  Oral, Q6H PRN **OR** acetaminophen (TYLENOL) suppository 650 mg, 650 mg, Rectal, Q6H PRN, Howerter, Justin B, DO, 650 mg at 02/09/21 1718 .  alum & mag hydroxide-simeth (MAALOX/MYLANTA) 200-200-20 MG/5ML suspension 30 mL, 30 mL, Oral, Q4H PRN, Pokhrel, Laxman, MD, 30 mL at 02/11/21 0357 .  budesonide (PULMICORT) nebulizer solution 0.5 mg, 0.5 mg, Nebulization, BID, Vassie Loll, MD, 0.5 mg at 02/14/21 0734 .  diphenoxylate-atropine (LOMOTIL) 2.5-0.025 MG per tablet 1 tablet, 1 tablet, Oral, QID PRN, Vassie Loll, MD, 1 tablet at 02/10/21 2145 .  enoxaparin (LOVENOX) injection 40 mg, 40 mg, Subcutaneous, Q24H, Pokhrel, Laxman, MD, 40 mg at 02/13/21 1610 .  finasteride (PROSCAR) tablet 5 mg, 5 mg, Oral, Daily, Howerter, Justin B, DO, 5 mg at 02/14/21 0942 .  gabapentin (NEURONTIN) tablet 300 mg, 300 mg, Oral, BID, Vassie Loll, MD, 300 mg at 02/14/21 0936 .  ipratropium-albuterol (DUONEB) 0.5-2.5 (3) MG/3ML nebulizer solution 3 mL, 3 mL, Nebulization, Q4H PRN, Pokhrel, Laxman, MD .  ipratropium-albuterol (DUONEB) 0.5-2.5 (3) MG/3ML nebulizer solution 3 mL, 3 mL, Nebulization, BID, Pokhrel, Laxman, MD, 3 mL at 02/14/21 0734 .  lamoTRIgine (LAMICTAL) tablet 400 mg, 400 mg, Oral, BID, Howerter, Justin B, DO, 400 mg at 02/14/21 0941 .  levETIRAcetam (KEPPRA) tablet 500 mg, 500 mg, Oral, BID, Kirby-Graham, Beather Arbour, NP, 500 mg at 02/14/21 1005 .  [START ON 02/17/2021] levETIRAcetam (KEPPRA) tablet 500 mg, 500 mg, Oral, Daily, Kirby-Graham, Beather Arbour, NP .  lip balm (CARMEX)  ointment 1 application, 1 application, Topical, PRN, Pokhrel, Laxman, MD .  lisinopril (ZESTRIL) tablet 2.5 mg, 2.5 mg, Oral, Daily, Howerter, Justin B, DO, 2.5 mg at 02/14/21 0939 .  LORazepam (ATIVAN) injection 1 mg, 1 mg, Intravenous, Q4H PRN, Kirby-Graham, Beather Arbour, NP .  methylPREDNISolone sodium succinate (SOLU-MEDROL) 40 mg/mL injection 40 mg, 40 mg, Intravenous, Daily, Pokhrel, Laxman, MD, 40 mg at 02/14/21 0933 .  metoprolol  tartrate (LOPRESSOR) tablet 37.5 mg, 37.5 mg, Oral, BID, Howerter, Justin B, DO, 37.5 mg at 02/14/21 0935 .  mycophenolate (CELLCEPT) capsule 500 mg, 500 mg, Oral, BID, Howerter, Justin B, DO, 500 mg at 02/14/21 0942 .  potassium chloride 10 mEq in 100 mL IVPB, 10 mEq, Intravenous, Q1 Hr x 4, Pokhrel, Laxman, MD, Last Rate: 100 mL/hr at 02/14/21 1146, 10 mEq at 02/14/21 1146 .  saccharomyces boulardii (FLORASTOR) capsule 250 mg, 250 mg, Oral, BID, Vassie Loll, MD, 250 mg at 02/14/21 0941 .  simvastatin (ZOCOR) tablet 40 mg, 40 mg, Oral, Daily, Howerter, Justin B, DO, 40 mg at 02/14/21 0936   Exam: Current vital signs: BP 112/67   Pulse 68   Temp 98.4 F (36.9 C) (Oral)   Resp 18   Ht 5\' 10"  (1.778 m)   Wt 78.7 kg   SpO2 96%   BMI 24.91 kg/m  Vital signs in last 24 hours: Temp:  [97.9 F (36.6 C)-98.4 F (36.9 C)] 98.4 F (36.9 C) (05/09 0823) Pulse Rate:  [68-103] 68 (05/09 1131) Resp:  [17-19] 18 (05/09 0823) BP: (112-155)/(64-89) 112/67 (05/09 1131) SpO2:  [92 %-99 %] 96 % (05/09 1131) FiO2 (%):  [32 %] 32 % (05/09 0737) Weight:  [78.7 kg] 78.7 kg (05/09 0451)  PE: GENERAL: Fairly well-appearing male lying in bed. He is awake and alert s/p seizure. NAD HEENT: - Normocephalic and atraumatic. Chewing motion noted to mouth with perioral fasciculations.  LUNGS - Normal respiratory effort.  CV - RRR. ABDOMEN - Soft, nontender Ext: warm, well perfused Psych: He is calm and cooperative. He cries x 2 during exam.   NEURO:  Mental Status: AA&Ox3  Speech/Language: speech is limited on NP exam, but he is able to answer questions appropriately without dysarthria or aphasia. Comprehension intact but somewhat delayed. When asked to repeat a sentence, he will only say one word of that sentence.  Cranial Nerves:  II: PERRL 76mm/brisk.  III, IV, VI: EOMI. Lid elevation symmetric and full.  V: sensation is intact and symmetrical to face.  VII: Smile is symmetrical. Able to puff  cheeks and raise eyebrows.  VIII: hearing intact to voice IX, X: palate elevation is symmetric. Phonation normal.  XI: normal sternocleidomastoid and trapezius muscle strength 1m is symmetrical without fasciculations.   Motor: RUE: grips  5/5       triceps 5/5  biceps  5/5                 LUE: grips  5/5     triceps  5/5    biceps   5/5            RLE:  knee  5/5   thigh  5/5     plantar flexion  5/5     dorsiflexion  5 /5            LLE:  knee 5 /5   thigh  5/5     plantar flexion   5/5     dorsiflexion  5/5 Tone is normal. Bulk is normal.  Sensation- Intact to light touch bilaterally in all four extremities.  Coordination: FTN intact bilaterally. No pronator drift.  DTRs: RUE:  brachioradialis 3+            RLE:  Patella 3+               LUE:  brachioradialis  2+              LLE: patella 2+ Gait- deferred  NIHSS:  1a Level of Consciousness: 0 1b LOC Questions: 0 1c LOC Commands: 0 2 Best Gaze: 0 3 Visual: 0 4 Facial Palsy: 0 5a Motor Arm - left: 0 5b Motor Arm - Right: 0 6a Motor Leg - Left: 0 6b Motor Leg - Right: 0 7 Limb Ataxia: 0 8 Sensory: 0 9 Best Language: 0 10 Dysarthria: 0 11 Extinction and Inattention: 0 TOTAL: 0   Labs I have reviewed labs in epic and the results pertinent to this consultation are: Lamotrigine level 13.5      Na 139    K 2.9    Mg 2    Creatinine 1.57   CBC    Component Value Date/Time   WBC 9.5 02/14/2021 0158   RBC 3.36 (L) 02/14/2021 0158   HGB 9.9 (L) 02/14/2021 0158   HCT 31.2 (L) 02/14/2021 0158   PLT 312 02/14/2021 0158   MCV 92.9 02/14/2021 0158   MCH 29.5 02/14/2021 0158   MCHC 31.7 02/14/2021 0158   RDW 13.4 02/14/2021 0158   LYMPHSABS 0.3 (L) 02/08/2021 0017   MONOABS 0.3 02/08/2021 0017   EOSABS 0.0 02/08/2021 0017   BASOSABS 0.0 02/08/2021 0017    CMP     Component Value Date/Time   NA 138 02/14/2021 0158   K 2.9 (L) 02/14/2021 0158   CL 104 02/14/2021 0158   CO2 26 02/14/2021 0158   GLUCOSE 155 (H)  02/14/2021 0158   BUN 23 02/14/2021 0158   CREATININE 1.57 (H) 02/14/2021 0158   CALCIUM 8.1 (L) 02/14/2021 0158   PROT 5.3 (L) 02/08/2021 0017   ALBUMIN 2.8 (L) 02/08/2021 0017   AST 13 (L) 02/08/2021 0017   ALT 14 02/08/2021 0017   ALKPHOS 51 02/08/2021 0017   BILITOT 0.9 02/08/2021 0017   GFRNONAA 47 (L) 02/14/2021 0158   GFRAA 52 (L) 12/17/2014 0455   No imaging  Assessment: 70 yo male with a history of seizure disorder on Lamictal and Gabapentin at home. He was admitted for sepsis 6 days ago and started on Vancomycin and Cefepime. He had seizure like activity yesterday and again this a.m. x 2. Ativan was given. Neurology was asked to consult due to seizure like activity here. On NPs exam, findings as above. Due to noted chewing motions of mouth and perioral fasciculations, Keppra was loaded with 1500mg  IV and patient to start Keppra taper x 6 days, then stop. Home Lamictal and Gabapentin to be continued. On MD exam at later time, patient's exam was completely back to normal without noted oral chewing motions or fasciculations. His mental status was back to baseline as well.   This is likely provoked breakthrough seizure secondary to Cefepime toxicity. Cefepime stopped yesterday.   Impression: 1. Provoked, breakthrough seizure activity most likely due to Cefepime use or in the face of sepsis.   Recommendations/Plan:  -No further Cefepime.  -Keppra 1500mg  IV x 1, then Keppra 500mg  po bid x 3 days, then 500mg  po qd x 3, then stop.  -Observe patient for at least 24 more  hours before discharging.  -Continue home Lamotrigine and Gabapentin.  -Have patient f/up with his home neurologist within 2-4 weeks after discharge.  -Continue to correct infection and metabolic derangements as you are doing.  -Ativan 1mg  prn a seizure lasting greater than 5 minutes.  -Seizure precautions. -No driving, tub baths, climbing ladders, etc until seen by his neurologist. NP researched [place] seizure laws  which state no driving x 6 months or until neurologist states the patient can drive.   Pt seen by Jimmye NormanKaren Kirby-Graham, NP/Neuro and later by MD.   Pager: [phone]

## 2021-02-14 NOTE — Plan of Care (Signed)
  Problem: Respiratory: Goal: Ability to maintain adequate ventilation will improve Outcome: Progressing   Problem: Nutrition: Goal: Adequate nutrition will be maintained Outcome: Progressing   Problem: Coping: Goal: Level of anxiety will decrease Outcome: Progressing   

## 2021-02-14 NOTE — Progress Notes (Signed)
Patient found sitting on side of bed eating, but not responding to questions and making chewing motions with his mouth.  Placed back in bed, HR 97, BP 155/74, oxygen saturation 97% on 2L [place].  DR. Pokhrel notified and here to see patient. Patient starting to respond after 3 minutes and moving all extremities.  Denies pain/SOB, but remains lethargic.  NP Kirby-Graham from Neuro here to examine patient. Will continue to monitor for seizures.

## 2021-02-15 ENCOUNTER — Other Ambulatory Visit (HOSPITAL_COMMUNITY): Payer: Self-pay

## 2021-02-15 LAB — CBC
HCT: 30.4 % — ABNORMAL LOW (ref 39.0–52.0)
Hemoglobin: 9.6 g/dL — ABNORMAL LOW (ref 13.0–17.0)
MCH: 29.9 pg (ref 26.0–34.0)
MCHC: 31.6 g/dL (ref 30.0–36.0)
MCV: 94.7 fL (ref 80.0–100.0)
Platelets: 301 10*3/uL (ref 150–400)
RBC: 3.21 MIL/uL — ABNORMAL LOW (ref 4.22–5.81)
RDW: 13.4 % (ref 11.5–15.5)
WBC: 9.7 10*3/uL (ref 4.0–10.5)
nRBC: 0 % (ref 0.0–0.2)

## 2021-02-15 LAB — BASIC METABOLIC PANEL
Anion gap: 7 (ref 5–15)
BUN: 19 mg/dL (ref 8–23)
CO2: 27 mmol/L (ref 22–32)
Calcium: 8.2 mg/dL — ABNORMAL LOW (ref 8.9–10.3)
Chloride: 105 mmol/L (ref 98–111)
Creatinine, Ser: 1.59 mg/dL — ABNORMAL HIGH (ref 0.61–1.24)
GFR, Estimated: 46 mL/min — ABNORMAL LOW (ref >60)
Glucose, Bld: 115 mg/dL — ABNORMAL HIGH (ref 70–99)
Potassium: 3.5 mmol/L (ref 3.5–5.1)
Sodium: 139 mmol/L (ref 135–145)

## 2021-02-15 LAB — MAGNESIUM: Magnesium: 2 mg/dL (ref 1.7–2.4)

## 2021-02-15 MED ORDER — LEVETIRACETAM 500 MG PO TABS
ORAL_TABLET | ORAL | 0 refills | Status: DC
  Filled 2021-02-15: qty 10, 5d supply, fill #0

## 2021-02-15 MED ORDER — LEVETIRACETAM 500 MG PO TABS
ORAL_TABLET | ORAL | 0 refills | Status: AC
  Filled 2021-02-15: qty 7, 5d supply, fill #0

## 2021-02-15 NOTE — Care Management Important Message (Signed)
Important Message  Patient Details  Name: Ryan Gentry MRN: [id number] Date of Birth: 1950-10-18   Medicare Important Message Given:  Yes     Renie Ora 02/15/2021, 11:01 AM

## 2021-02-15 NOTE — TOC Transition Note (Signed)
Transition of Care (TOC) - CM/SW Discharge Note Donn Pierini RN, BSN Transitions of Care Unit 4E- RN Case Manager See Treatment Team for direct phone # Cross Coverage for 5E   Patient Details  Name: Ryan Gentry MRN: [id number] Date of Birth: 26-Apr-1951  Transition of Care [facility]) CM/SW Contact:  Darrold Span, RN Phone Number: 02/15/2021, 3:00 PM   Clinical Narrative:    Noted pt with d/c order for today, HHPT order and msg received that pt will need home 02.  CM spoke with pt at bedside- per pt he is from [place]- and only staying here storm term- at the [facility] with wife until he is able to drive back home. Per pt he is hopeful that he feels up to driving home by the end of the week or first of next Week.  Explained to pt that [facility] services would not start here if he was planning on leaving by the end of the week- and that he would need to f/u with his primary care back home as he is out of the service area to set up [facility] from here- (pt reports he has 5 doctors back home)- explained to pt that if he is able to drive back home that he may not qualify for [facility] services by the time he gets back home. Provided pt with printed HH order that he can take back to his PCP and f/u with them for [facility] needs. (pt would need new HH orders from his PCP on return home)  Home 02 order has been placed, per pt he is agreeable to using whomever can service him both here and back home. Call made to [employer] to check and see if they service pt's zip code back home.  Zach with [employer] has confirmed that they do service pt's zip code back home and can service him here prior to discharge- once processed they will deliver a portable concentrator for pt to use to drive home with.  Pt had questions about the concentrators that you see on TV- explained to pt Medicare typically does not cover those- and if he remained on 02- he could look into those once he returned home. Pt voiced understanding.   Noted previous TOC note  mentioned w/c ordered- do not see a DME order for w/c - no w/c to be delivered prior to discharge.   TOC has delivered meds to bedside. Wife to come transport pt to hotel.    Final next level of care: Home/Self Care Barriers to Discharge: No Barriers Identified   Patient Goals and CMS Choice Patient states their goals for this hospitalization and ongoing recovery are:: Return to hotel with wife. CMS Medicare.gov Compare Post Acute Care list provided to:: Patient Choice offered to / list presented to : [facility]  Discharge Placement               Home        Discharge Plan and Services   Discharge Planning Services: CM Consult,Medication Assistance,DC out of service area Post Acute Care Choice: Durable Medical Equipment,Home Health          DME Arranged: Oxygen DME Agency: [employer] Date DME Agency Contacted: 02/15/21 Time DME Agency Contacted: 1300 Representative spoke with at DME Agency: Ian Malkin HH Arranged: NA,PT (return home to [place]- out of service area) [facility] Agency: [facility] Date [facility] Agency Contacted: 02/12/21 Time HH Agency Contacted: 1615 Representative spoke with at [facility] Agency: Kandee [phone]  Social Determinants of Health (SDOH)  Interventions     Readmission Risk Interventions Readmission Risk Prevention Plan 02/15/2021  Transportation Screening Complete  PCP or Specialist Appt within 5-7 Days Complete  Home Care Screening Complete  Medication Review (RN CM) Complete  Some recent data might be hidden

## 2021-02-15 NOTE — Progress Notes (Signed)
Physical Therapy Treatment Patient Details Name: Ryan Gentry MRN: [id number] DOB: 07-Jun-1951 Today's Date: 02/15/2021    History of Present Illness 70 y.o. male presents to [facility] ED on 02/07/2021 with complaints of SOB. Pt found to be septic 2/2 RLL PNA. s/p seizures 5/8 and 5/9. Pt also reports recent history of vomiting and diarrhea. PMH includes ANCA associated vasculitis, seizure, generalized anxitey disorder, urinary incontinence, HLD.    PT Comments    Pt making good progress toward goals this date and with good participation and tolerance for functional mobility tasks. Emphasis on sit to stand safety, gait progression and pursed-lip breathing/activity pacing with exertion. Pt desat on RA with exertion.  SATURATION QUALIFICATIONS: (This note is used to comply with regulatory documentation for home oxygen) Patient Saturations on Room Air at Rest = 90% Patient Saturations on Room Air while Ambulating = 86% Patient Saturations on 2L Liters of oxygen while Ambulating = 92% Please briefly explain why patient needs home oxygen: Pt desat on RA with exertion, with 2L O2 [place] maintains SpO2 92% and greater.  Pt continues to benefit from PT services to progress toward functional mobility goals. Will plan to initiate stair training next session. Discharge recommendations below remain appropriate.  Follow Up Recommendations  Home health PT;Supervision - Intermittent;Supervision for mobility/OOB (vs OPPT if HHPT unavailable.)     Equipment Recommendations  Wheelchair (measurements PT);Wheelchair cushion (measurements PT);Rolling walker with 5" wheels    Recommendations for Other Services       Precautions / Restrictions Precautions Precautions: Fall Restrictions Weight Bearing Restrictions: No    Mobility  Bed Mobility Overal bed mobility: Needs Assistance Bed Mobility: Supine to Sit;Sit to Supine     Supine to sit: Modified independent (Device/Increase time) Sit to supine: Modified  independent (Device/Increase time)   General bed mobility comments: pt performed with HOB and no rails, initally said he would need help but he did not, increased time to perform.    Transfers Overall transfer level: Needs assistance Equipment used: Rolling walker (2 wheeled) Transfers: Sit to/from Stand Sit to Stand: Min guard         General transfer comment: Min guard for safety. Cues for hand placement/technique but poor carryover of instruction from previous session and within session.  Ambulation/Gait Ambulation/Gait assistance: Min guard;Supervision Gait Distance (Feet): 200 Feet Assistive device: Rolling walker (2 wheeled) Gait Pattern/deviations: Step-through pattern;Decreased stride length;Decreased step length - right;Decreased step length - left Gait velocity: very slow Gait velocity interpretation: <1.8 ft/sec, indicate of risk for recurrent falls General Gait Details: Very slow, guarded gait with downward gaze; postural cues. Cues for RW mgmt esp with turns but no overt LOB. Sp02 variable, initally 90% on RA but lots of cues for slow/deep breathing technique, then desat to 86% with exertion and good pleth signal. Pt placed on 2L and SpO2 92-93% with exertion, RN notified/   Stairs             Wheelchair Mobility    Modified Rankin (Stroke Patients Only)       Balance Overall balance assessment: Needs assistance Sitting-balance support: Feet supported;No upper extremity supported Sitting balance-Leahy Scale: Good     Standing balance support: During functional activity Standing balance-Leahy Scale: Fair Standing balance comment: able to static stand unsupported and don mask but needs BUE support of RW for weight shifting/dynamic tasks  Cognition Arousal/Alertness: Awake/alert Behavior During Therapy: WFL for tasks assessed/performed Overall Cognitive Status: Within Functional Limits for tasks assessed                              Awareness: Emergent Problem Solving: Requires verbal cues General Comments: Pt calm and cooperative, some decreased insight into deficits but listened well and receptive to instruction.      Exercises Other Exercises Other Exercises: Reviewed IS use and given handout to reinforce freq/technique. Handout also for pursed-lip breathing techniques and supine strengthening.    General Comments General comments (skin integrity, edema, etc.): no family present in room, pt reports plan is to DC to hotel with "maybe HHPT or OPPT" per MD rec for ~1 week prior to leaving to either return to home in [facility] or continue on road trip to [place] with spouse. SpO2 90-91% on RA at rest and 96-100% on RA with 2L O2 [place], needs 2L with exertion/ambulation to remain >88% and mild DOE. BP 150/83 (102) prior to OOB and denies dizziness.      Pertinent Vitals/Pain Pain Assessment: No/denies pain Pain Intervention(s): Monitored during session           PT Goals (current goals can now be found in the care plan section) Acute Rehab PT Goals Patient Stated Goal: To return home and see family/friends. PT Goal Formulation: With patient Time For Goal Achievement: 02/24/21 Potential to Achieve Goals: Fair Progress towards PT goals: Progressing toward goals    Frequency    Min 3X/week      PT Plan Current plan remains appropriate       AM-PAC PT "6 Clicks" Mobility   Outcome Measure  Help needed turning from your back to your side while in a flat bed without using bedrails?: None Help needed moving from lying on your back to sitting on the side of a flat bed without using bedrails?: None Help needed moving to and from a bed to a chair (including a wheelchair)?: A Little Help needed standing up from a chair using your arms (e.g., wheelchair or bedside chair)?: A Little Help needed to walk in hospital room?: A Little Help needed climbing 3-5 steps with a railing? : A Little 6  Click Score: 20    End of Session Equipment Utilized During Treatment: Gait belt;Oxygen Activity Tolerance: Patient tolerated treatment well Patient left: in bed;with call bell/phone within reach;with bed alarm set Nurse Communication: Mobility status;Other (comment) (increased O2 needs with exertion today) PT Visit Diagnosis: Unsteadiness on feet (R26.81);Other abnormalities of gait and mobility (R26.89);Muscle weakness (generalized) (M62.81)     Time: 0981-1914 PT Time Calculation (min) (ACUTE ONLY): 34 min  Charges:  $Gait Training: 8-22 mins $Therapeutic Activity: 8-22 mins                     Aveline Daus P., PTA Acute Rehabilitation Services Pager: [phone] Office: [phone]   Angus Palms 02/15/2021, 12:35 PM

## 2021-02-15 NOTE — Progress Notes (Signed)
SATURATION QUALIFICATIONS: (This note is used to comply with regulatory documentation for home oxygen)  Patient Saturations on Room Air at Rest = 90%  Patient Saturations on Room Air while Ambulating = 86%  Patient Saturations on 2L Liters of oxygen while Ambulating = 92%  Please briefly explain why patient needs home oxygen: Pt desat on RA with exertion, with 2L O2 [place] maintains SpO2 92% and greater.

## 2021-02-15 NOTE — Progress Notes (Signed)
Occupational Therapy Treatment Patient Details Name: Maki Sweetser MRN: [id number] DOB: [birth year] Today's Date: 02/15/2021    History of present illness 70 y.o. male presents to [facility] ED on 02/07/2021 with complaints of SOB. Pt found to be septic 2/2 RLL PNA. s/p seizures 5/8 and 5/9. Pt also reports recent history of vomiting and diarrhea. PMH includes ANCA associated vasculitis, seizure, generalized anxitey disorder, urinary incontinence, HLD.   OT comments  Pt making steady progress towards OT goals this session. Session focus on education related to energy conservation strategies and reinforcement of breathing exercises to increase cardiopulmonary status as indicated below. Pt reports plan of DC'ing to hotel with his god son and staying there until at least May 18th, therefore updated DC plan to include HHOT, tub seat and 3n1. Education provided on managing oxygen in hotel room and using energy conservation techniques as needed.Will follow acutely per POC.    Follow Up Recommendations  Home health OT;Supervision - Intermittent    Equipment Recommendations  Tub/shower seat;3 in 1 bedside commode    Recommendations for Other Services      Precautions / Restrictions Precautions Precautions: Fall Restrictions Weight Bearing Restrictions: No       Mobility Bed Mobility Overal bed mobility: Needs Assistance   General bed mobility comments: session completed from bed level    Transfers Overall transfer level: Needs assistance         General transfer comment: pt declined OOB session as pt in between phone calls    Balance Overall balance assessment: Needs assistance                           ADL either performed or assessed with clinical judgement   ADL Overall ADL's : Needs assistance/impaired                                       General ADL Comments: session focus on education related to energy conservation for home with pts plan to DC to  hotel as pt was traveling from out of town, pts god son will be with him at hotel, reviewed all energy conservation techniques as well as breahting exercies to increase cardiopulmonary status.     Vision       Perception     Praxis      Cognition Arousal/Alertness: Awake/alert Behavior During Therapy: Restless (tangential) Overall Cognitive Status: No family/caregiver present to determine baseline cognitive functioning                             Awareness: Emergent Problem Solving: Requires verbal cues General Comments: pt very tangential during session, switching back and forth from topic to topic, needs redirection at times but when focused on task but is very receptive and appreciative of education        Exercises Exercises: Other exercises Other Exercises Other Exercises: pt able to state correct freqeuncy for IS Other Exercises: PLB technique x10 reps Other Exercises: diaphragmatic breathing x10 reps Other Exercises: PLB breathing with trunk flexion x10 reps   Shoulder Instructions       General Comments issued pt handouts on energy conservation and reviewed stratgies for DC to hotel- pt on 2L during session with SpO2 >90% during session    Pertinent Vitals/ Pain       Pain Assessment: No/denies pain Pain  Intervention(s): Monitored during session  Home Living                                          Prior Functioning/Environment              Frequency  Min 2X/week        Progress Toward Goals  OT Goals(current goals can now be found in the care plan section)  Progress towards OT goals: Progressing toward goals  Acute Rehab OT Goals Patient Stated Goal: to get to hotel today with his god son OT Goal Formulation: With patient Time For Goal Achievement: 02/25/21 Potential to Achieve Goals: Good  Plan Discharge plan remains appropriate;Frequency remains appropriate    Co-evaluation                 AM-PAC OT  "6 Clicks" Daily Activity     Outcome Measure   Help from another person eating meals?: None Help from another person taking care of personal grooming?: A Little Help from another person toileting, which includes using toliet, bedpan, or urinal?: A Little Help from another person bathing (including washing, rinsing, drying)?: A Little Help from another person to put on and taking off regular upper body clothing?: A Little Help from another person to put on and taking off regular lower body clothing?: A Little 6 Click Score: 19    End of Session    OT Visit Diagnosis: Unsteadiness on feet (R26.81);Repeated falls (R29.6);Muscle weakness (generalized) (M62.81)   Activity Tolerance Patient tolerated treatment well   Patient Left in bed;with call bell/phone within reach   Nurse Communication Mobility status        Time: 0160-1093 OT Time Calculation (min): 12 min  Charges: OT General Charges $OT Visit: 1 Visit OT Treatments $Self Care/Home Management : 8-22 mins  Lenor Derrick., COTA/L Acute Rehabilitation Services [phone]   Barron Schmid 02/15/2021, 2:57 PM

## 2021-02-15 NOTE — Discharge Summary (Signed)
Physician Discharge Summary  Ryan Gentry TKP:[id number] DOB: 12-04-1950 DOA: 02/07/2021  PCP: Pcp, No  Admit date: 02/07/2021 Discharge date: 02/15/2021  Admitted From: Home  Discharge disposition: Home with Home Health  Recommendations for Outpatient Follow-Up:   . Follow up with your primary care provider in one week.  . Check CBC, BMP, magnesium in the next visit  Discharge Diagnosis:   Principal Problem:   Severe sepsis (HCC) Active Problems:   Acute respiratory failure with hypoxia (HCC)   Seizures (HCC)   CAP (community acquired pneumonia)   Nausea & vomiting   Hyponatremia   Elevated troponin   GAD (generalized anxiety disorder)   Urinary incontinence   BPH (benign prostatic hyperplasia)   Tobacco abuse   Discharge Condition: Improved.  Diet recommendation: Low sodium, heart healthy.    Wound care: None.  Code status: Full.   History of Present Illness:   Ryan Gentry a 70 y.o.malewith medical history significant forANCA associated vasculitis, seizure disorder, generalized anxiety disorder, urinary incontinence, hyperlipidemia, presented to the with hospital with shortness of breath, subjective fevers and chills with generalized myalgia and cough.  He also had few episodes of nausea and vomiting.  Of note, patient was traveling from [place] in route to [place].  He is on immunosuppressants with CellCept.  Patient presented to the ED and was noted to have oxygen saturation in the 80s on room air.  Patient was  given nasal cannula oxygen and was admitted to hospital.  Patient had creatinine of 1.6-1.7 in 2016 and around 1.6 in June 2021. Patient was then admitted to hospital for severe sepsis secondary to pneumonia with acute hypoxic respiratory failure.    Hospital Course:   Following conditions were addressed during hospitalization as listed below,  Severe sepsis secondary to bacterial pneumonia Present on admission.    Patient was done  broad-spectrum antibiotics initially.  Negative MRSA PCR, so vancomycin was discontinued.  Patient has completed cefepime and Zithromax course.Marland Kitchen    Received Pulmicort, duo nebs, oxygen supplementation, flutter valve and incentive spirometer during hospitalization.. Blood cultures negative so far.  Urine culture no growth.  Procalcitonin elevated on presentation.  Patient was treated for bacterial pneumonia.    Acute respiratory failure with hypoxia  Patient states that he has history of COPD.  Acute respiratory likely secondary to pneumonia on the background of COPD. Patient initially required nonrebreather mask followed by nasal cannula oxygen.  Received with IV antibiotics, bronchodilators.     Completed Solu-Medrol course in the hospital.   Patient also received few doses of Lasix during hospitalization.  At this time patient has qualified for home oxygen on discharge.  Hypokalemia.    Improved after repletion.  Seizures with breakthrough seizure   Patient had breakthrough seizure during hospitalization.  He was on cefepime which was discontinued.  Neurology was consulted.  Neurology recommended Keppra load followed by few days of Keppra on discharge.  We will continue  Lamictal and Neurontin from home..   Lamictal level at 13.5.   Patient follows up with his neurologist back in [place].  Nausea & vomiting, diarrhea Resolved.  Latest sodium of 139  Hyponatremia Resolved.    HCTZ will be resumed on discharge  Elevated troponin Without any chest pain or EKG changes.  Likely nonspecific elevation.  GAD (generalized anxiety disorder) -continue anxiolytics on discharge  Hypophosphatemia, hypomagnesemia Replenished and improved.   BPH (benign prostatic hyperplasia) -continue proscar and flomax from home  ANCA associated vasculitis  Continue CellCept and follow-up with  rheumatology as outpatient.     Essential HTN Continue lisinopril and metoprolol from home  Debility, weakness.   Patient was seen by physical therapy who recommend home PT on discharge.    Disposition.  At this time, patient is stable for disposition home with physical therapy and home oxygen.  Patient will need to follow-up with his rheumatologist, neurologist and primary care physician as outpatient  Medical Consultants:    Neurology  Procedures:    None Subjective:   Today, patient was seen and examined at bedside.  Appears okay.  Needed oxygen during ambulation.  Denies chest pain, cough, fever or chills.  Discharge Exam:   Vitals:   02/15/21 0406 02/15/21 0934  BP: (!) 105/93   Pulse: 73 (!) 101  Resp: 16 16  Temp: 98.8 F (37.1 C)   SpO2: 95% 94%   Vitals:   02/14/21 2043 02/15/21 0031 02/15/21 0406 02/15/21 0934  BP: 118/69 126/74 (!) 105/93   Pulse: 88 77 73 (!) 101  Resp: Temp: 97.8 F (36.6 C) 98.3 F (36.8 C) 98.8 F (37.1 C)   TempSrc: Oral Oral Oral   SpO2: 97% 94% 95% 94%  Weight:   79.1 kg   Height:       General: Alert awake, not in obvious distress, on nasal cannula oxygen HENT: pupils equally reacting to light,  No scleral pallor or icterus noted. Oral mucosa is moist.  Chest:  .  Diminished breath sounds bilaterally. CVS: S1 &S2 heard. No murmur.  Regular rate and rhythm. Abdomen: Soft, nontender, nondistended.  Bowel sounds are heard.   Extremities: No cyanosis, clubbing or edema.  Peripheral pulses are palpable. Psych: Alert, awake and oriented, normal mood CNS:  No cranial nerve deficits.  Power equal in all extremities.   Skin: Warm and dry.  No rashes noted.  The results of significant diagnostics from this hospitalization (including imaging, microbiology, ancillary and laboratory) are listed below for reference.     Diagnostic Studies:   DG Chest Port 1 View  Result Date: 02/07/2021 CLINICAL DATA:  Cough and fevers EXAM: PORTABLE CHEST 1 VIEW COMPARISON:  12/15/2014 FINDINGS: Cardiac shadow is stable. Loop recorder is noted. Aortic  calcifications are seen. The lungs are well aerated bilaterally with some suggestion of early infiltrate in the right lung base. No bony abnormality is noted. IMPRESSION: Increased opacity in the right lung base suggestive of early infiltrate. Electronically Signed   By: Alcide Clever M.D.   On: 02/07/2021 16:14     Labs:   Basic Metabolic Panel: Recent Labs  Lab 02/09/21 0247 02/10/21 0406 02/11/21 0255 02/12/21 0240 02/14/21 0158 02/15/21 0232  NA 135 137 137 139 138 139  K 3.4* 4.1 3.7 3.8 2.9* 3.5  CL 107 106 106 106 104 105  CO2 23 22 21* 20* 26 27  GLUCOSE 120* 174* 136* 159* 155* 115*  BUN 28* 23 19  CREATININE 1.41* 1.38* 1.39* 1.47* 1.57* 1.59*  CALCIUM 8.0* 8.2* 8.7* 8.8* 8.1* 8.2*  MG 2.2 2.0 2.1 2.2 2.0 2.0  PHOS 2.9 2.3*  --  3.4  --   --    GFR Estimated Creatinine Clearance: 44.6 mL/min (A) (by C-G formula based on SCr of 1.59 mg/dL (H)). Liver Function Tests: No results for input(s): AST, ALT, ALKPHOS, BILITOT, PROT, ALBUMIN in the last 168 hours. No results for input(s): LIPASE, AMYLASE in the last 168 hours. No results for input(s): AMMONIA in the last  168 hours. Coagulation profile No results for input(s): INR, PROTIME in the last 168 hours.  CBC: Recent Labs  Lab 02/10/21 0406 02/11/21 0255 02/12/21 0240 02/14/21 0158 02/15/21 0232  WBC 7.6 14.0* 11.6* 9.5 9.7  HGB 9.8* 11.0* 11.3* 9.9* 9.6*  HCT 30.7* 33.2* 35.4* 31.2* 30.4*  MCV 93.3 91.0 93.2 92.9 94.7  PLT 179 235 277 312 301   Cardiac Enzymes: No results for input(s): CKTOTAL, CKMB, CKMBINDEX, TROPONINI in the last 168 hours. BNP: Invalid input(s): POCBNP CBG: Recent Labs  Lab 02/14/21 0555  GLUCAP 94   D-Dimer No results for input(s): DDIMER in the last 72 hours. Hgb A1c No results for input(s): HGBA1C in the last 72 hours. Lipid Profile No results for input(s): CHOL, HDL, LDLCALC, TRIG, CHOLHDL, LDLDIRECT in the last 72 hours. Thyroid function studies No results for  input(s): TSH, T4TOTAL, T3FREE, THYROIDAB in the last 72 hours.  Invalid input(s): FREET3 Anemia work up No results for input(s): VITAMINB12, FOLATE, FERRITIN, TIBC, IRON, RETICCTPCT in the last 72 hours. Microbiology Recent Results (from the past 240 hour(s))  Blood Culture (routine x 2)     Status: None   Collection Time: 02/07/21  4:20 PM   Specimen: BLOOD LEFT WRIST  Result Value Ref Range Status   Specimen Description BLOOD LEFT WRIST  Final   Special Requests   Final    BOTTLES DRAWN AEROBIC AND ANAEROBIC Blood Culture adequate volume   Culture   Final    NO GROWTH 5 DAYS Performed at [facility], [address] [place], [place] [address]    Report Status 02/12/2021 FINAL  Final  Blood Culture (routine x 2)     Status: None   Collection Time: 02/07/21  4:27 PM   Specimen: BLOOD LEFT FOREARM  Result Value Ref Range Status   Specimen Description BLOOD LEFT FOREARM  Final   Special Requests   Final    BOTTLES DRAWN AEROBIC AND ANAEROBIC Blood Culture adequate volume   Culture   Final    NO GROWTH 5 DAYS Performed at [facility], [address] [place], [place] [address]    Report Status 02/12/2021 FINAL  Final  Resp Panel by RT-PCR (Flu A&B, Covid) Nasopharyngeal Swab     Status: None   Collection Time: 02/07/21  4:42 PM   Specimen: Nasopharyngeal Swab; Nasopharyngeal(NP) swabs in vial transport medium  Result Value Ref Range Status   SARS Coronavirus 2 by RT PCR NEGATIVE NEGATIVE Final    Comment: (NOTE) SARS-CoV-2 target nucleic acids are NOT DETECTED.  The SARS-CoV-2 RNA is generally detectable in upper respiratory specimens during the acute phase of infection. The lowest concentration of SARS-CoV-2 viral copies this assay can detect is 138 copies/mL. A negative result does not preclude SARS-Cov-2 infection and should not be used as the sole basis for treatment or other patient management decisions. A negative result may occur with  improper specimen  collection/handling, submission of specimen other than nasopharyngeal swab, presence of viral mutation(s) within the areas targeted by this assay, and inadequate number of viral copies(<138 copies/mL). A negative result must be combined with clinical observations, patient history, and epidemiological information. The expected result is Negative.  Fact Sheet for Patients:  BloggerCourse.comhttps://www.fda.gov/media/152166/download  Fact Sheet for Healthcare Providers:  SeriousBroker.ithttps://www.fda.gov/media/152162/download  This test is no t yet approved or cleared by the [place] FDA and  has been authorized for detection and/or diagnosis of SARS-CoV-2 by FDA under an Emergency Use Authorization (EUA). This  EUA will remain  in effect (meaning this test can be used) for the duration of the COVID-19 declaration under Section 564(b)(1) of the Act, 21 U.S.C.section 360bbb-3(b)(1), unless the authorization is terminated  or revoked sooner.       Influenza A by PCR NEGATIVE NEGATIVE Final   Influenza B by PCR NEGATIVE NEGATIVE Final    Comment: (NOTE) The Xpert Xpress SARS-CoV-2/FLU/RSV plus assay is intended as an aid in the diagnosis of influenza from Nasopharyngeal swab specimens and should not be used as a sole basis for treatment. Nasal washings and aspirates are unacceptable for Xpert Xpress SARS-CoV-2/FLU/RSV testing.  Fact Sheet for Patients: BloggerCourse.com  Fact Sheet for Healthcare Providers: SeriousBroker.it  This test is not yet approved or cleared by the [place] FDA and has been authorized for detection and/or diagnosis of SARS-CoV-2 by FDA under an Emergency Use Authorization (EUA). This EUA will remain in effect (meaning this test can be used) for the duration of the COVID-19 declaration under Section 564(b)(1) of the Act, 21 U.S.C. section 360bbb-3(b)(1), unless the authorization is terminated or revoked.  Performed at [facility], [address], [place], [place] [address]   Urine culture     Status: None   Collection Time: 02/07/21  6:20 PM   Specimen: Urine, Random  Result Value Ref Range Status   Specimen Description URINE, RANDOM  Final   Special Requests NONE  Final   Culture   Final    NO GROWTH Performed at [facility], [address] [place], [place] [address]    Report Status 02/09/2021 FINAL  Final  MRSA PCR Screening     Status: None   Collection Time: 02/07/21  9:40 PM   Specimen: Nasal Mucosa; Nasopharyngeal  Result Value Ref Range Status   MRSA by PCR NEGATIVE NEGATIVE Final    Comment:        The GeneXpert MRSA Assay (FDA approved for NASAL specimens only), is one component of a comprehensive MRSA colonization surveillance program. It is not intended to diagnose MRSA infection nor to guide or monitor treatment for MRSA infections. Performed at [facility], [address], [place], [place] [address]   Gastrointestinal Panel by PCR , Stool     Status: None   Collection Time: 02/07/21 11:29 PM   Specimen: Nasal Mucosa; Stool  Result Value Ref Range Status   Campylobacter species NOT DETECTED NOT DETECTED Final   Plesimonas shigelloides NOT DETECTED NOT DETECTED Final   Salmonella species NOT DETECTED NOT DETECTED Final   Yersinia enterocolitica NOT DETECTED NOT DETECTED Final   Vibrio species NOT DETECTED NOT DETECTED Final   Vibrio cholerae NOT DETECTED NOT DETECTED Final   Enteroaggregative E coli (EAEC) NOT DETECTED NOT DETECTED Final   Enteropathogenic E coli (EPEC) NOT DETECTED NOT DETECTED Final   Enterotoxigenic E coli (ETEC) NOT DETECTED NOT DETECTED Final   Shiga like toxin producing E coli (STEC) NOT DETECTED NOT DETECTED Final   Shigella/Enteroinvasive E coli (EIEC) NOT DETECTED NOT DETECTED Final   Cryptosporidium NOT DETECTED NOT DETECTED Final   Cyclospora cayetanensis NOT DETECTED NOT DETECTED Final   Entamoeba histolytica NOT DETECTED NOT  DETECTED Final   Giardia lamblia NOT DETECTED NOT DETECTED Final   Adenovirus F40/41 NOT DETECTED NOT DETECTED Final   Astrovirus NOT DETECTED NOT DETECTED Final   Norovirus GI/GII NOT DETECTED NOT DETECTED Final   Rotavirus A NOT DETECTED NOT DETECTED Final   Sapovirus (I, II, IV,  and V) NOT DETECTED NOT DETECTED Final    Comment: Performed at [facility], [address] [place], [place] [address]  C Difficile Quick Screen w PCR reflex     Status: None   Collection Time: 02/09/21 11:33 AM   Specimen: STOOL  Result Value Ref Range Status   C Diff antigen NEGATIVE NEGATIVE Final   C Diff toxin NEGATIVE NEGATIVE Final   C Diff interpretation No C. difficile detected.  Final    Comment: Performed at [facility], [address], [place], [place] [address]  Respiratory (~20 pathogens) panel by PCR     Status: None   Collection Time: 02/09/21  5:18 PM   Specimen: Nasopharyngeal Swab; Respiratory  Result Value Ref Range Status   Adenovirus NOT DETECTED NOT DETECTED Final   Coronavirus 229E NOT DETECTED NOT DETECTED Final    Comment: (NOTE) The Coronavirus on the Respiratory Panel, DOES NOT test for the novel  Coronavirus (2019 nCoV)    Coronavirus HKU1 NOT DETECTED NOT DETECTED Final   Coronavirus NL63 NOT DETECTED NOT DETECTED Final   Coronavirus OC43 NOT DETECTED NOT DETECTED Final   Metapneumovirus NOT DETECTED NOT DETECTED Final   Rhinovirus / Enterovirus NOT DETECTED NOT DETECTED Final   Influenza A NOT DETECTED NOT DETECTED Final   Influenza B NOT DETECTED NOT DETECTED Final   Parainfluenza Virus 1 NOT DETECTED NOT DETECTED Final   Parainfluenza Virus 2 NOT DETECTED NOT DETECTED Final   Parainfluenza Virus 3 NOT DETECTED NOT DETECTED Final   Parainfluenza Virus 4 NOT DETECTED NOT DETECTED Final   Respiratory Syncytial Virus NOT DETECTED NOT DETECTED Final   Bordetella pertussis NOT DETECTED NOT DETECTED Final   Bordetella Parapertussis NOT DETECTED NOT  DETECTED Final   Chlamydophila pneumoniae NOT DETECTED NOT DETECTED Final   Mycoplasma pneumoniae NOT DETECTED NOT DETECTED Final    Comment: Performed at [facility], [address], [place], [place] [address]     Discharge Instructions:   Discharge Instructions    Call MD for:   Complete by: As directed    Worsening symptoms including worsening breathing   Call MD for:  severe uncontrolled pain   Complete by: As directed    Call MD for:  temperature >100.4   Complete by: As directed    Diet general   Complete by: As directed    Discharge instructions   Complete by: As directed    Do not overexert but remain active.  Follow-up with your primary care physician in 1 week. Continue physical therapy.   Increase activity slowly   Complete by: As directed      Allergies as of 02/15/2021      Reactions   Citrullus Vulgaris Nausea And Vomiting   Other reaction(s): Upper Airway Edema   Cucumber Extract Nausea And Vomiting   Other reaction(s): Upper Airway Edema      Medication List    TAKE these medications   albuterol 108 (90 Base) MCG/ACT inhaler Commonly known as: VENTOLIN HFA Inhale 2 puffs into the lungs every 6 (six) hours as needed for wheezing or shortness of breath.   calcium-vitamin D 500-200 MG-UNIT Tabs tablet Commonly known as: OSCAL WITH D Take 1 tablet by mouth in the morning and at bedtime.   CENTRUM SILVER PO Take 1 tablet by mouth daily.   ferrous sulfate 325 (65 FE) MG tablet Take 325 mg by mouth in the morning and at bedtime.   finasteride 5 MG  tablet Commonly known as: PROSCAR Take 5 mg by mouth daily. Notes to patient: Prostate    gabapentin 300 MG capsule Commonly known as: NEURONTIN Take 600 mg by mouth daily. Notes to patient: Nerve pain    hydrochlorothiazide 12.5 MG tablet Commonly known as: HYDRODIURIL Take 12.5 mg by mouth daily. Notes to patient: Blood pressure/diuretic   HYDROcodone-acetaminophen 5-325 MG tablet Commonly  known as: NORCO/VICODIN Take 1 tablet by mouth every 4 (four) hours as needed for moderate pain.   ibuprofen 200 MG tablet Commonly known as: ADVIL Take 400 mg by mouth every 6 (six) hours as needed for headache or moderate pain.   lamoTRIgine 200 MG tablet Commonly known as: LAMICTAL Take 400 mg by mouth 2 (two) times daily. Notes to patient: Seizures    levETIRAcetam 500 MG tablet Commonly known as: Keppra Take 1 tablet (500 mg total) by mouth 2 (two) times daily for 2 days, THEN 1 tablet (500 mg total) daily for 3 days. Start taking on: Feb 15, 2021 Notes to patient: Seizures    lisinopril 2.5 MG tablet Commonly known as: ZESTRIL Take 2.5 mg by mouth daily. Notes to patient: Blood pressure   LORazepam 1 MG tablet Commonly known as: ATIVAN Take 1 mg by mouth 2 (two) times daily as needed for anxiety.   metoprolol tartrate 25 MG tablet Commonly known as: LOPRESSOR Take 37.5 mg by mouth in the morning and at bedtime.   mycophenolate 500 MG tablet Commonly known as: CELLCEPT Take 500 mg by mouth 2 (two) times daily.   omeprazole 40 MG capsule Commonly known as: PRILOSEC Take 40 mg by mouth daily.   oxybutynin 5 MG tablet Commonly known as: DITROPAN Take 5 mg by mouth daily.   sertraline 100 MG tablet Commonly known as: ZOLOFT Take 100 mg by mouth daily.   sertraline 25 MG tablet Commonly known as: ZOLOFT Take 25 mg by mouth daily.   simvastatin 40 MG tablet Commonly known as: ZOCOR Take 40 mg by mouth daily.   tamsulosin 0.4 MG Caps capsule Commonly known as: FLOMAX Take 0.4 mg by mouth daily after breakfast.   VISINE-A OP Apply 1 drop to eye daily as needed (dry eyes).   vitamin C 500 MG tablet Commonly known as: ASCORBIC ACID Take 500 mg by mouth 2 (two) times daily.            Durable Medical Equipment  (From admission, onward)         Start     Ordered   02/15/21 1402  For home use only DME oxygen  Once       Question Answer Comment   Length of Need 6 Months   Mode or (Route) Nasal cannula   Liters per Minute 2   Frequency Continuous (stationary and portable oxygen unit needed)   Oxygen conserving device Yes   Oxygen delivery system Gas      02/15/21 1401          Follow-up Information    primary care provider. Schedule an appointment as soon as possible for a visit in 1 week(s).        [facility] Follow up.   Why: Home 02 arranged with portable tank/concentrator to be delivered to room prior to discharge for pt to use to travel back home.  Contact information: [address] [place] [address] [phone]        home health Follow up.   Why: Order for HHPT provided for you  to follow up with your primary care doctor (your PCP will need to re-order the [facility] for where you live and set you up with a referral to a [facility] agency).                Time coordinating discharge: 39 minutes  Signed:  Vineta Carone  [facility] 02/15/2021, 2:42 PM

## 2021-12-16 IMAGING — DX DG CHEST 1V PORT
1 series · 1 of 1 positions shown · non-contrast
Comparison: 12/15/2014

CLINICAL DATA: Cough and fevers

EXAM:
PORTABLE CHEST 1 VIEW

[chest ap]
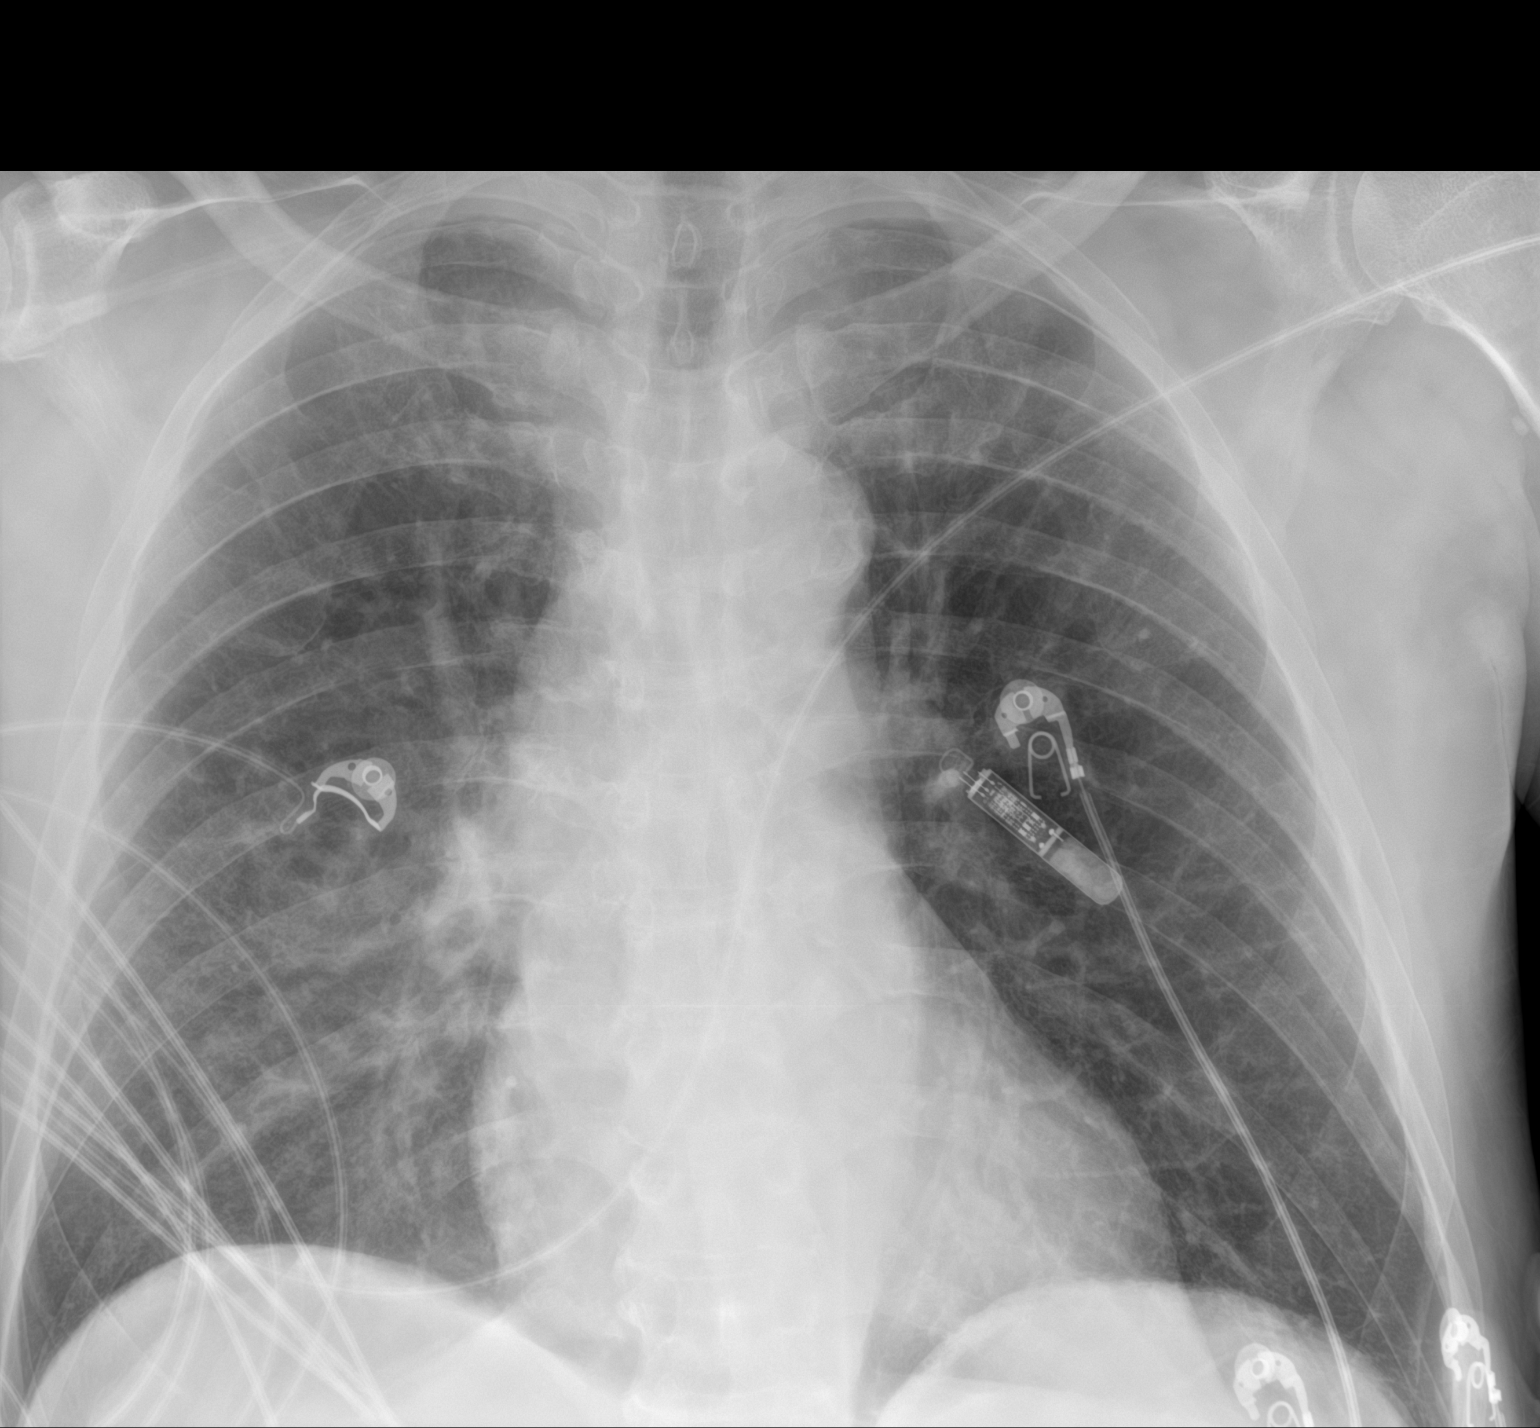

[1 of 1 positions shown; findings below may reference images not displayed]

FINDINGS: Cardiac shadow is stable. Loop recorder is noted. Aortic
calcifications are seen. The lungs are well aerated bilaterally with
some suggestion of early infiltrate in the right lung base. No bony
abnormality is noted.
IMPRESSION: Increased opacity in the right lung base suggestive of early
infiltrate.

## 2021-12-18 IMAGING — DX DG CHEST 1V PORT
2 series · 2 of 2 positions shown · non-contrast
Comparison: Single-view of the chest 02/07/2021. PA and lateral
chest 12/15/2014.

CLINICAL DATA: Dyspnea.

EXAM:
PORTABLE CHEST 1 VIEW

[chest ap (1 of 2)]
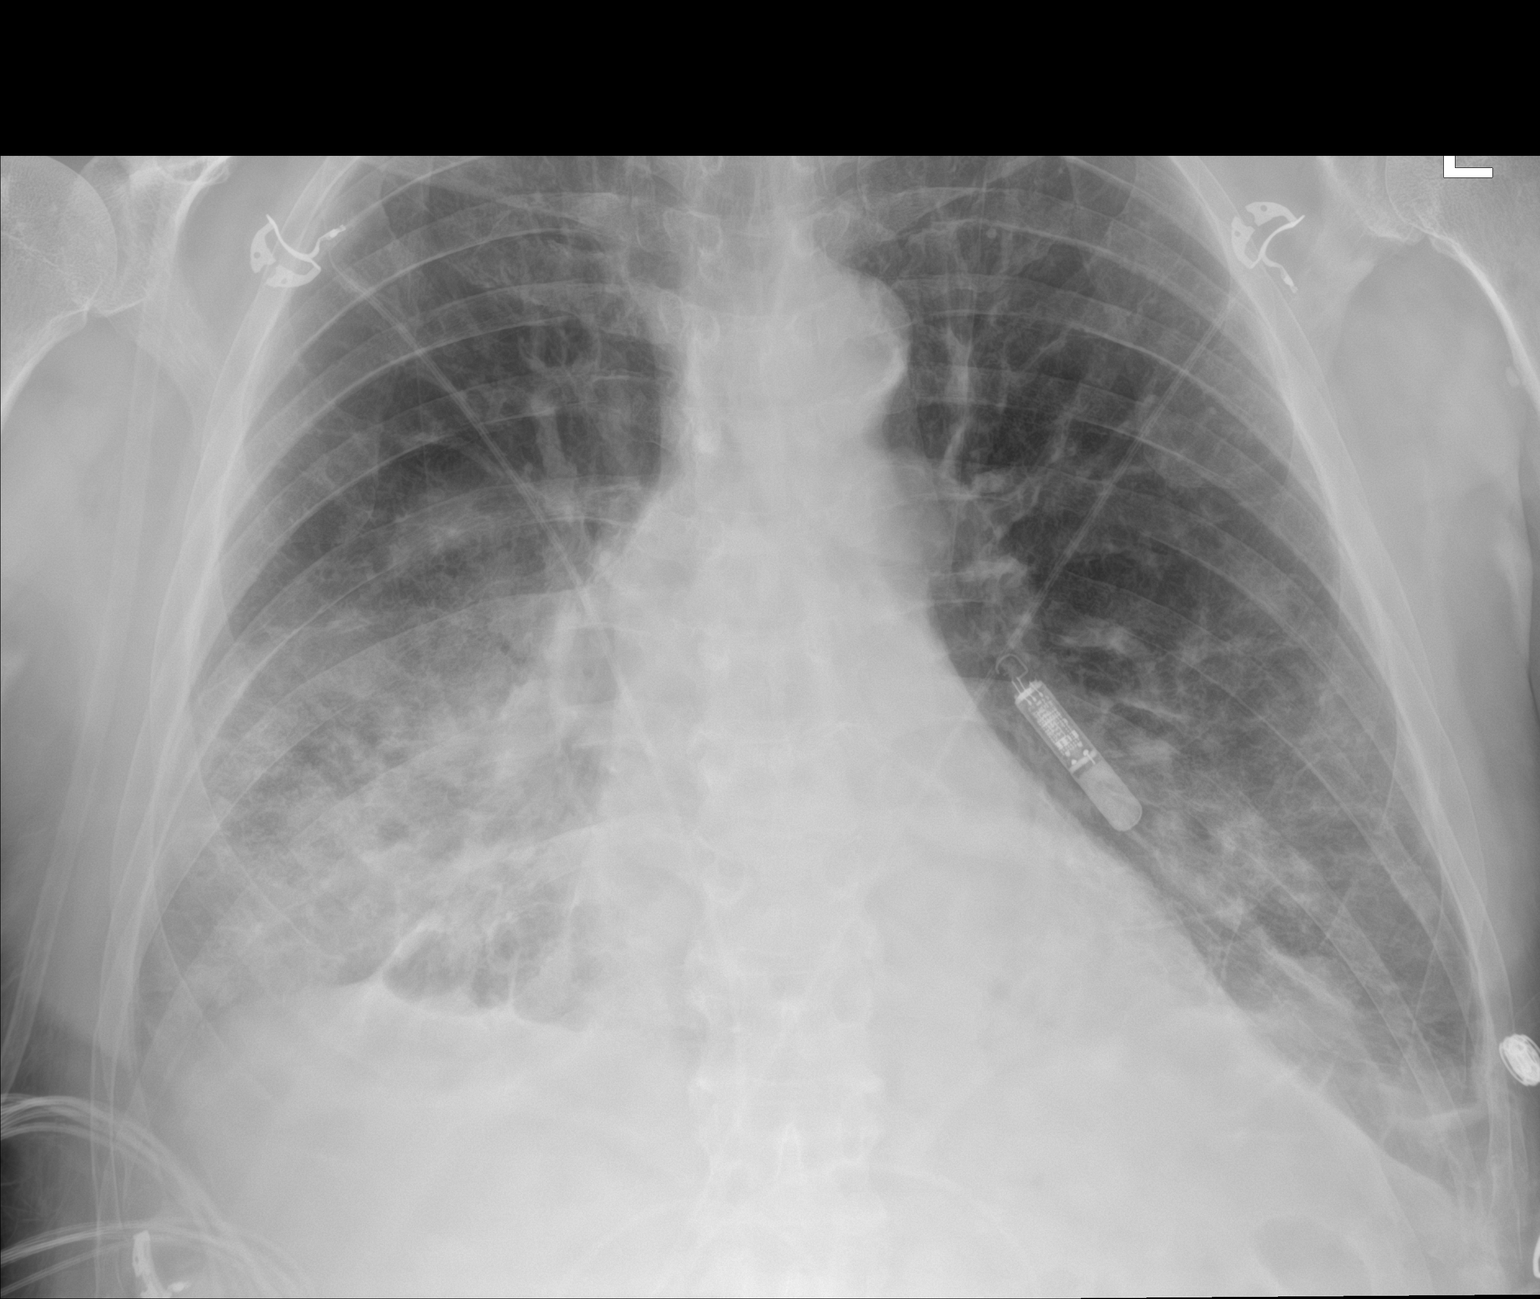

[chest ap (2 of 2)]
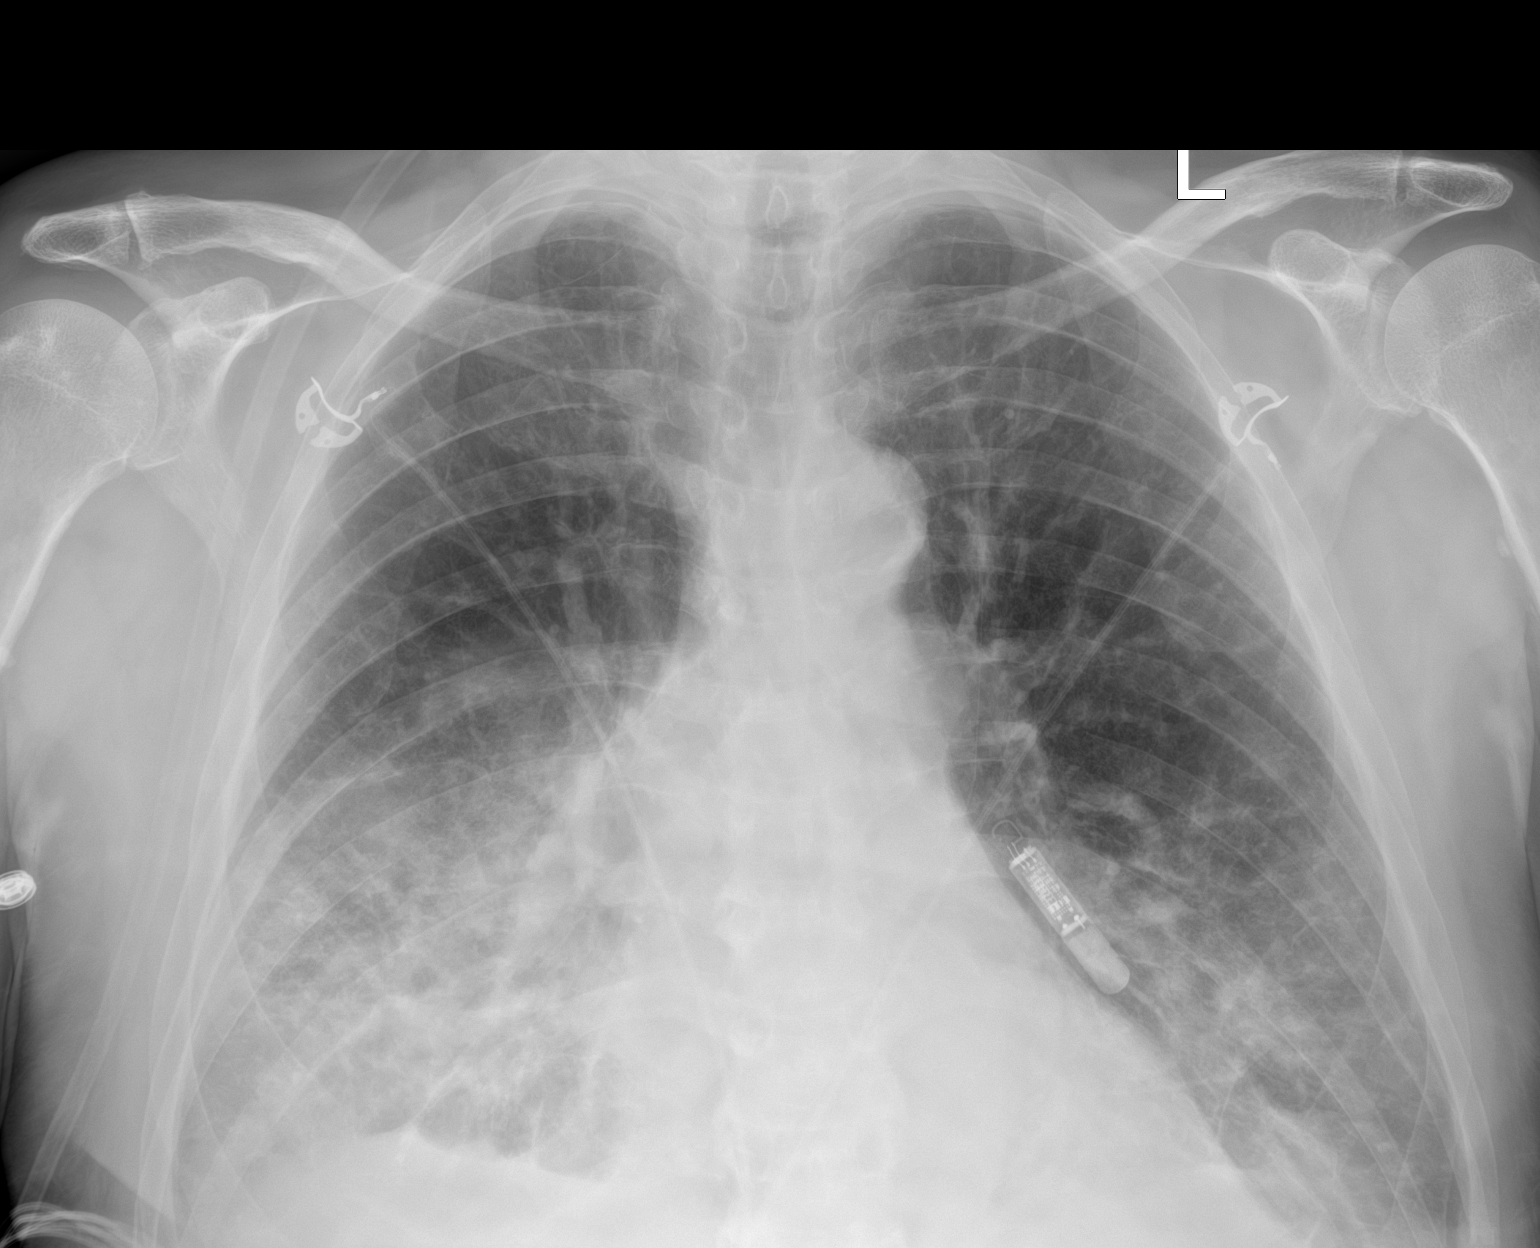

[2 of 2 positions shown; findings below may reference images not displayed]

FINDINGS: The lungs are emphysematous. There is right much worse than left
basilar airspace disease most consistent with pneumonia. Heart size
is normal. Aortic atherosclerosis. No pneumothorax or pleural fluid.
Loop recorder noted. No acute or focal bony abnormality.
IMPRESSION: Right worse than left basilar airspace disease most consistent with
pneumonia.

Aortic Atherosclerosis (SLKTO-[id number].C) and Emphysema (SLKTO-IRQ.W).
